# Patient Record
Sex: Male | Born: 1945 | Race: White | Hispanic: No | Smoking: Former smoker
Health system: Southern US, Community
[De-identification: ages and names within clinical notes are randomized; demographics above are authoritative.]

## PROBLEM LIST (undated history)

## (undated) DIAGNOSIS — E785 Hyperlipidemia, unspecified: Secondary | ICD-10-CM

## (undated) DIAGNOSIS — I10 Essential (primary) hypertension: Secondary | ICD-10-CM

## (undated) DIAGNOSIS — M1990 Unspecified osteoarthritis, unspecified site: Secondary | ICD-10-CM

## (undated) HISTORY — PX: FEMORAL-PERONEAL BYPASS GRAFT: SHX5163

---

## 2000-08-11 ENCOUNTER — Emergency Department (HOSPITAL_COMMUNITY): Admission: EM | Admit: 2000-08-11 | Discharge: 2000-08-11 | Payer: Self-pay | Admitting: Emergency Medicine

## 2000-08-13 ENCOUNTER — Emergency Department (HOSPITAL_COMMUNITY): Admission: EM | Admit: 2000-08-13 | Discharge: 2000-08-13 | Payer: Self-pay | Admitting: Emergency Medicine

## 2000-08-18 ENCOUNTER — Inpatient Hospital Stay (HOSPITAL_COMMUNITY): Admission: EM | Admit: 2000-08-18 | Discharge: 2000-08-21 | Payer: Self-pay | Admitting: Emergency Medicine

## 2000-08-18 ENCOUNTER — Encounter: Payer: Self-pay | Admitting: Emergency Medicine

## 2000-08-18 ENCOUNTER — Encounter: Payer: Self-pay | Admitting: Pediatrics

## 2000-08-21 ENCOUNTER — Encounter: Payer: Self-pay | Admitting: *Deleted

## 2001-06-07 ENCOUNTER — Encounter: Payer: Self-pay | Admitting: Emergency Medicine

## 2001-06-07 ENCOUNTER — Inpatient Hospital Stay (HOSPITAL_COMMUNITY): Admission: EM | Admit: 2001-06-07 | Discharge: 2001-06-14 | Payer: Self-pay | Admitting: Emergency Medicine

## 2001-06-14 ENCOUNTER — Inpatient Hospital Stay (HOSPITAL_COMMUNITY)
Admission: RE | Admit: 2001-06-14 | Discharge: 2001-06-23 | Payer: Self-pay | Admitting: Physical Medicine & Rehabilitation

## 2001-12-23 ENCOUNTER — Encounter: Payer: Self-pay | Admitting: Family Medicine

## 2001-12-23 ENCOUNTER — Ambulatory Visit (HOSPITAL_COMMUNITY): Admission: RE | Admit: 2001-12-23 | Discharge: 2001-12-23 | Payer: Self-pay | Admitting: Family Medicine

## 2002-01-13 ENCOUNTER — Ambulatory Visit (HOSPITAL_COMMUNITY): Admission: RE | Admit: 2002-01-13 | Discharge: 2002-01-13 | Payer: Self-pay | Admitting: Family Medicine

## 2002-01-13 ENCOUNTER — Encounter: Payer: Self-pay | Admitting: Family Medicine

## 2002-06-03 ENCOUNTER — Ambulatory Visit (HOSPITAL_COMMUNITY): Admission: RE | Admit: 2002-06-03 | Discharge: 2002-06-03 | Payer: Self-pay

## 2002-06-29 ENCOUNTER — Inpatient Hospital Stay (HOSPITAL_COMMUNITY): Admission: AD | Admit: 2002-06-29 | Discharge: 2002-07-11 | Payer: Self-pay | Admitting: Cardiology

## 2002-07-05 ENCOUNTER — Encounter: Payer: Self-pay | Admitting: Surgery

## 2002-07-06 ENCOUNTER — Encounter: Payer: Self-pay | Admitting: Surgery

## 2002-07-07 ENCOUNTER — Encounter: Payer: Self-pay | Admitting: Surgery

## 2002-07-08 ENCOUNTER — Encounter: Payer: Self-pay | Admitting: Surgery

## 2002-08-30 ENCOUNTER — Emergency Department (HOSPITAL_COMMUNITY): Admission: EM | Admit: 2002-08-30 | Discharge: 2002-08-30 | Payer: Self-pay | Admitting: Emergency Medicine

## 2002-09-16 ENCOUNTER — Ambulatory Visit (HOSPITAL_COMMUNITY): Admission: RE | Admit: 2002-09-16 | Discharge: 2002-09-16 | Payer: Self-pay | Admitting: Internal Medicine

## 2002-09-16 ENCOUNTER — Encounter: Payer: Self-pay | Admitting: Internal Medicine

## 2002-09-21 ENCOUNTER — Ambulatory Visit (HOSPITAL_COMMUNITY): Admission: RE | Admit: 2002-09-21 | Discharge: 2002-09-21 | Payer: Self-pay | Admitting: Internal Medicine

## 2004-04-04 ENCOUNTER — Emergency Department (HOSPITAL_COMMUNITY): Admission: EM | Admit: 2004-04-04 | Discharge: 2004-04-04 | Payer: Self-pay | Admitting: Family Medicine

## 2004-04-05 ENCOUNTER — Inpatient Hospital Stay (HOSPITAL_COMMUNITY): Admission: EM | Admit: 2004-04-05 | Discharge: 2004-04-09 | Payer: Self-pay | Admitting: Emergency Medicine

## 2004-06-26 ENCOUNTER — Ambulatory Visit: Payer: Self-pay | Admitting: *Deleted

## 2004-06-26 ENCOUNTER — Ambulatory Visit: Payer: Self-pay | Admitting: Internal Medicine

## 2004-07-03 ENCOUNTER — Ambulatory Visit: Payer: Self-pay | Admitting: Internal Medicine

## 2004-07-05 ENCOUNTER — Ambulatory Visit: Payer: Self-pay | Admitting: *Deleted

## 2004-07-11 ENCOUNTER — Ambulatory Visit: Payer: Self-pay | Admitting: *Deleted

## 2004-09-30 ENCOUNTER — Ambulatory Visit: Payer: Self-pay | Admitting: Internal Medicine

## 2004-11-26 ENCOUNTER — Ambulatory Visit: Payer: Self-pay | Admitting: Family Medicine

## 2004-12-02 ENCOUNTER — Ambulatory Visit (HOSPITAL_COMMUNITY): Admission: RE | Admit: 2004-12-02 | Discharge: 2004-12-02 | Payer: Self-pay | Admitting: Specialist

## 2005-01-27 ENCOUNTER — Ambulatory Visit (HOSPITAL_COMMUNITY): Admission: RE | Admit: 2005-01-27 | Discharge: 2005-01-27 | Payer: Self-pay | Admitting: Specialist

## 2005-04-25 ENCOUNTER — Ambulatory Visit: Payer: Self-pay | Admitting: Internal Medicine

## 2005-06-12 ENCOUNTER — Ambulatory Visit: Payer: Self-pay | Admitting: Internal Medicine

## 2005-07-01 ENCOUNTER — Ambulatory Visit: Payer: Self-pay | Admitting: Family Medicine

## 2005-08-12 ENCOUNTER — Ambulatory Visit: Payer: Self-pay | Admitting: Family Medicine

## 2005-10-22 ENCOUNTER — Ambulatory Visit: Payer: Self-pay | Admitting: Family Medicine

## 2005-11-25 ENCOUNTER — Ambulatory Visit (HOSPITAL_COMMUNITY): Admission: RE | Admit: 2005-11-25 | Discharge: 2005-11-25 | Payer: Self-pay | Admitting: Vascular Surgery

## 2005-11-28 ENCOUNTER — Ambulatory Visit: Payer: Self-pay | Admitting: Family Medicine

## 2005-12-01 ENCOUNTER — Inpatient Hospital Stay (HOSPITAL_COMMUNITY): Admission: RE | Admit: 2005-12-01 | Discharge: 2005-12-11 | Payer: Self-pay | Admitting: Vascular Surgery

## 2006-02-02 ENCOUNTER — Ambulatory Visit: Payer: Self-pay | Admitting: Family Medicine

## 2006-02-13 ENCOUNTER — Ambulatory Visit: Payer: Self-pay | Admitting: Family Medicine

## 2006-03-23 ENCOUNTER — Ambulatory Visit (HOSPITAL_COMMUNITY): Admission: RE | Admit: 2006-03-23 | Discharge: 2006-03-23 | Payer: Self-pay | Admitting: Cardiovascular Disease

## 2006-03-28 ENCOUNTER — Encounter: Admission: RE | Admit: 2006-03-28 | Discharge: 2006-03-28 | Payer: Self-pay | Admitting: Orthopedic Surgery

## 2006-03-31 ENCOUNTER — Encounter: Admission: RE | Admit: 2006-03-31 | Discharge: 2006-03-31 | Payer: Self-pay | Admitting: Orthopedic Surgery

## 2006-04-02 ENCOUNTER — Encounter (HOSPITAL_COMMUNITY): Admission: RE | Admit: 2006-04-02 | Discharge: 2006-07-01 | Payer: Self-pay | Admitting: Cardiovascular Disease

## 2006-04-03 ENCOUNTER — Ambulatory Visit (HOSPITAL_COMMUNITY): Admission: RE | Admit: 2006-04-03 | Discharge: 2006-04-03 | Payer: Self-pay | Admitting: Orthopedic Surgery

## 2006-04-07 ENCOUNTER — Encounter: Admission: RE | Admit: 2006-04-07 | Discharge: 2006-04-07 | Payer: Self-pay | Admitting: Orthopedic Surgery

## 2006-04-10 ENCOUNTER — Ambulatory Visit (HOSPITAL_COMMUNITY): Admission: RE | Admit: 2006-04-10 | Discharge: 2006-04-10 | Payer: Self-pay | Admitting: Cardiovascular Disease

## 2006-04-20 ENCOUNTER — Inpatient Hospital Stay (HOSPITAL_COMMUNITY): Admission: RE | Admit: 2006-04-20 | Discharge: 2006-04-24 | Payer: Self-pay | Admitting: Orthopedic Surgery

## 2006-05-21 ENCOUNTER — Ambulatory Visit: Payer: Self-pay | Admitting: Family Medicine

## 2006-07-23 ENCOUNTER — Ambulatory Visit: Payer: Self-pay | Admitting: Internal Medicine

## 2006-12-25 ENCOUNTER — Ambulatory Visit: Payer: Self-pay | Admitting: Internal Medicine

## 2007-02-15 ENCOUNTER — Ambulatory Visit: Payer: Self-pay | Admitting: Vascular Surgery

## 2007-05-21 ENCOUNTER — Ambulatory Visit: Payer: Self-pay | Admitting: Vascular Surgery

## 2007-08-30 ENCOUNTER — Encounter (INDEPENDENT_AMBULATORY_CARE_PROVIDER_SITE_OTHER): Payer: Self-pay | Admitting: Family Medicine

## 2007-08-30 ENCOUNTER — Ambulatory Visit: Payer: Self-pay | Admitting: Internal Medicine

## 2007-08-30 LAB — CONVERTED CEMR LAB
ALT: 12 units/L (ref 0–53)
Alkaline Phosphatase: 74 units/L (ref 39–117)
Chloride: 107 meq/L (ref 96–112)
Glucose, Bld: 143 mg/dL — ABNORMAL HIGH (ref 70–99)
Total Bilirubin: 0.4 mg/dL (ref 0.3–1.2)
Triglycerides: 97 mg/dL (ref <150)

## 2007-11-08 ENCOUNTER — Ambulatory Visit: Payer: Self-pay | Admitting: Vascular Surgery

## 2007-12-29 ENCOUNTER — Ambulatory Visit: Payer: Self-pay | Admitting: Internal Medicine

## 2008-04-12 ENCOUNTER — Ambulatory Visit: Payer: Self-pay | Admitting: Internal Medicine

## 2008-04-27 ENCOUNTER — Ambulatory Visit: Payer: Self-pay | Admitting: Internal Medicine

## 2008-04-27 LAB — CONVERTED CEMR LAB
ALT: 15 units/L (ref 0–53)
Albumin: 4.7 g/dL (ref 3.5–5.2)
Alkaline Phosphatase: 65 units/L (ref 39–117)
BUN: 22 mg/dL (ref 6–23)
CO2: 21 meq/L (ref 19–32)
Calcium: 9.2 mg/dL (ref 8.4–10.5)
Cholesterol: 131 mg/dL (ref 0–200)
Creatinine, Ser: 1.09 mg/dL (ref 0.40–1.50)
LDL Cholesterol: 81 mg/dL (ref 0–99)
Total Bilirubin: 0.4 mg/dL (ref 0.3–1.2)

## 2008-05-03 ENCOUNTER — Ambulatory Visit: Payer: Self-pay | Admitting: Vascular Surgery

## 2008-06-01 ENCOUNTER — Ambulatory Visit: Payer: Self-pay | Admitting: Internal Medicine

## 2008-06-08 ENCOUNTER — Ambulatory Visit: Payer: Self-pay | Admitting: Internal Medicine

## 2008-07-11 ENCOUNTER — Encounter: Admission: RE | Admit: 2008-07-11 | Discharge: 2008-07-11 | Payer: Self-pay | Admitting: Cardiovascular Disease

## 2008-07-18 ENCOUNTER — Ambulatory Visit: Payer: Self-pay | Admitting: Family Medicine

## 2008-07-18 LAB — CONVERTED CEMR LAB
BUN: 23 mg/dL (ref 6–23)
Calcium: 9.4 mg/dL (ref 8.4–10.5)
Chloride: 106 meq/L (ref 96–112)
Glucose, Bld: 77 mg/dL (ref 70–99)
Potassium: 4.8 meq/L (ref 3.5–5.3)
Vit D, 1,25-Dihydroxy: 25 — ABNORMAL LOW (ref 30–89)

## 2008-11-15 ENCOUNTER — Ambulatory Visit: Payer: Self-pay | Admitting: Vascular Surgery

## 2008-11-16 ENCOUNTER — Ambulatory Visit: Payer: Self-pay | Admitting: Internal Medicine

## 2008-11-16 LAB — CONVERTED CEMR LAB
ALT: 12 units/L (ref 0–53)
Alkaline Phosphatase: 60 units/L (ref 39–117)
BUN: 27 mg/dL — ABNORMAL HIGH (ref 6–23)
Basophils Absolute: 0.1 10*3/uL (ref 0.0–0.1)
Basophils Relative: 1 % (ref 0–1)
CO2: 20 meq/L (ref 19–32)
Chloride: 109 meq/L (ref 96–112)
Eosinophils Absolute: 0.4 10*3/uL (ref 0.0–0.7)
GC Probe Amp, Urine: NEGATIVE
Glucose, Bld: 125 mg/dL — ABNORMAL HIGH (ref 70–99)
Hemoglobin: 12.7 g/dL — ABNORMAL LOW (ref 13.0–17.0)
MCHC: 31.9 g/dL (ref 30.0–36.0)
MCV: 93.4 fL (ref 78.0–100.0)
Monocytes Absolute: 0.5 10*3/uL (ref 0.1–1.0)
Monocytes Relative: 6 % (ref 3–12)
Neutro Abs: 5.7 10*3/uL (ref 1.7–7.7)
Neutrophils Relative %: 60 % (ref 43–77)
Potassium: 4.5 meq/L (ref 3.5–5.3)
RBC: 4.26 M/uL (ref 4.22–5.81)
Sodium: 143 meq/L (ref 135–145)
Total Bilirubin: 0.3 mg/dL (ref 0.3–1.2)
Total Protein: 7.8 g/dL (ref 6.0–8.3)
WBC: 9.6 10*3/uL (ref 4.0–10.5)

## 2009-02-14 ENCOUNTER — Ambulatory Visit: Payer: Self-pay | Admitting: Internal Medicine

## 2009-03-01 ENCOUNTER — Ambulatory Visit: Payer: Self-pay | Admitting: Internal Medicine

## 2009-03-02 ENCOUNTER — Ambulatory Visit: Payer: Self-pay | Admitting: Internal Medicine

## 2009-03-13 ENCOUNTER — Ambulatory Visit: Payer: Self-pay | Admitting: Internal Medicine

## 2009-05-08 ENCOUNTER — Ambulatory Visit: Payer: Self-pay | Admitting: Internal Medicine

## 2009-05-08 LAB — CONVERTED CEMR LAB
Cholesterol: 141 mg/dL (ref 0–200)
HDL: 35 mg/dL — ABNORMAL LOW (ref >39)
LDL Cholesterol: 86 mg/dL (ref 0–99)
Microalb, Ur: 3.07 mg/dL — ABNORMAL HIGH (ref 0.00–1.89)
PSA: 1.37 ng/mL (ref 0.10–4.00)
Total CHOL/HDL Ratio: 4
Triglycerides: 98 mg/dL (ref <150)
VLDL: 20 mg/dL (ref 0–40)

## 2009-05-16 ENCOUNTER — Ambulatory Visit: Payer: Self-pay | Admitting: Vascular Surgery

## 2009-05-22 ENCOUNTER — Ambulatory Visit: Payer: Self-pay | Admitting: Internal Medicine

## 2009-06-21 ENCOUNTER — Ambulatory Visit: Payer: Self-pay | Admitting: Gastroenterology

## 2009-08-13 ENCOUNTER — Ambulatory Visit: Payer: Self-pay | Admitting: Internal Medicine

## 2009-08-21 ENCOUNTER — Ambulatory Visit: Payer: Self-pay | Admitting: Gastroenterology

## 2009-08-23 ENCOUNTER — Ambulatory Visit: Payer: Self-pay | Admitting: Internal Medicine

## 2009-09-05 ENCOUNTER — Telehealth (INDEPENDENT_AMBULATORY_CARE_PROVIDER_SITE_OTHER): Payer: Self-pay | Admitting: *Deleted

## 2009-09-11 ENCOUNTER — Ambulatory Visit: Payer: Self-pay | Admitting: Gastroenterology

## 2009-09-19 ENCOUNTER — Encounter: Payer: Self-pay | Admitting: Gastroenterology

## 2009-11-07 ENCOUNTER — Ambulatory Visit: Payer: Self-pay | Admitting: Vascular Surgery

## 2009-12-13 ENCOUNTER — Ambulatory Visit: Payer: Self-pay | Admitting: Internal Medicine

## 2009-12-13 LAB — CONVERTED CEMR LAB
Cholesterol: 147 mg/dL (ref 0–200)
Free T4: 1.12 ng/dL (ref 0.80–1.80)
HCT: 41.9 % (ref 39.0–52.0)
HDL: 35 mg/dL — ABNORMAL LOW (ref >39)
Hemoglobin: 13.2 g/dL (ref 13.0–17.0)
Lymphocytes Relative: 31 % (ref 12–46)
Lymphs Abs: 3 10*3/uL (ref 0.7–4.0)
Monocytes Relative: 5 % (ref 3–12)
Neutro Abs: 5.6 10*3/uL (ref 1.7–7.7)
Neutrophils Relative %: 58 % (ref 43–77)
RBC: 4.57 M/uL (ref 4.22–5.81)
TSH: 1.074 microintl units/mL (ref 0.350–4.500)
Total CHOL/HDL Ratio: 4.2
Triglycerides: 91 mg/dL (ref <150)
VLDL: 18 mg/dL (ref 0–40)

## 2010-02-26 ENCOUNTER — Ambulatory Visit: Payer: Self-pay | Admitting: Internal Medicine

## 2010-02-26 LAB — CONVERTED CEMR LAB
ALT: 14 units/L (ref 0–53)
Albumin: 4.5 g/dL (ref 3.5–5.2)
Alkaline Phosphatase: 57 units/L (ref 39–117)
CO2: 19 meq/L (ref 19–32)
Hgb A1c MFr Bld: 9.7 % — ABNORMAL HIGH (ref <5.7)
LDL Cholesterol: 79 mg/dL (ref 0–99)
Potassium: 4.7 meq/L (ref 3.5–5.3)
Sodium: 138 meq/L (ref 135–145)
Triglycerides: 168 mg/dL — ABNORMAL HIGH (ref <150)

## 2010-04-05 ENCOUNTER — Ambulatory Visit: Payer: Self-pay | Admitting: Internal Medicine

## 2010-04-05 LAB — CONVERTED CEMR LAB
Albumin: 4.7 g/dL (ref 3.5–5.2)
Basophils Absolute: 0 10*3/uL (ref 0.0–0.1)
Basophils Relative: 0 % (ref 0–1)
CO2: 22 meq/L (ref 19–32)
Chloride: 103 meq/L (ref 96–112)
Creatinine, Ser: 1.03 mg/dL (ref 0.40–1.50)
Eosinophils Absolute: 0.3 10*3/uL (ref 0.0–0.7)
Eosinophils Relative: 3 % (ref 0–5)
Glucose, Bld: 114 mg/dL — ABNORMAL HIGH (ref 70–99)
Hgb A1c MFr Bld: 8.4 % — ABNORMAL HIGH (ref <5.7)
LDL Cholesterol: 98 mg/dL (ref 0–99)
MCHC: 31.7 g/dL (ref 30.0–36.0)
MCV: 94.9 fL (ref 78.0–100.0)
Monocytes Absolute: 0.5 10*3/uL (ref 0.1–1.0)
Monocytes Relative: 6 % (ref 3–12)
Neutro Abs: 5.2 10*3/uL (ref 1.7–7.7)
RBC: 4.12 M/uL — ABNORMAL LOW (ref 4.22–5.81)
RDW: 15.4 % (ref 11.5–15.5)
Total CHOL/HDL Ratio: 4
VLDL: 21 mg/dL (ref 0–40)
WBC: 8.8 10*3/uL (ref 4.0–10.5)

## 2010-08-22 ENCOUNTER — Ambulatory Visit: Payer: Self-pay | Admitting: Vascular Surgery

## 2010-09-04 ENCOUNTER — Encounter
Admission: RE | Admit: 2010-09-04 | Discharge: 2010-10-09 | Payer: Self-pay | Source: Home / Self Care | Attending: Vascular Surgery | Admitting: Vascular Surgery

## 2010-11-07 ENCOUNTER — Encounter (INDEPENDENT_AMBULATORY_CARE_PROVIDER_SITE_OTHER): Payer: Self-pay | Admitting: Family Medicine

## 2010-11-07 LAB — CONVERTED CEMR LAB
Basophils Absolute: 0.1 10*3/uL (ref 0.0–0.1)
CO2: 24 meq/L (ref 19–32)
Calcium: 9.9 mg/dL (ref 8.4–10.5)
Chloride: 102 meq/L (ref 96–112)
Creatinine, Ser: 1.17 mg/dL (ref 0.40–1.50)
Glucose, Bld: 146 mg/dL — ABNORMAL HIGH (ref 70–99)
Lymphs Abs: 4 10*3/uL (ref 0.7–4.0)
MCV: 93.6 fL (ref 78.0–100.0)
Neutro Abs: 6.6 10*3/uL (ref 1.7–7.7)
Neutrophils Relative %: 55 % (ref 43–77)
RBC: 4.22 M/uL (ref 4.22–5.81)
RDW: 13.3 % (ref 11.5–15.5)

## 2010-11-08 ENCOUNTER — Ambulatory Visit (HOSPITAL_COMMUNITY)
Admission: RE | Admit: 2010-11-08 | Discharge: 2010-11-08 | Payer: Self-pay | Source: Home / Self Care | Attending: Family Medicine | Admitting: Family Medicine

## 2010-11-14 ENCOUNTER — Encounter (INDEPENDENT_AMBULATORY_CARE_PROVIDER_SITE_OTHER): Payer: Self-pay | Admitting: Family Medicine

## 2011-01-09 ENCOUNTER — Ambulatory Visit (HOSPITAL_COMMUNITY)
Admission: RE | Admit: 2011-01-09 | Discharge: 2011-01-09 | Disposition: A | Discharge status: Home / Self Care | Payer: Medicare Other | Source: Ambulatory Visit | Attending: Cardiovascular Disease | Admitting: Cardiovascular Disease

## 2011-01-09 ENCOUNTER — Other Ambulatory Visit (HOSPITAL_COMMUNITY): Payer: Self-pay | Admitting: Cardiovascular Disease

## 2011-01-09 DIAGNOSIS — M549 Dorsalgia, unspecified: Secondary | ICD-10-CM

## 2011-01-09 DIAGNOSIS — I700 Atherosclerosis of aorta: Secondary | ICD-10-CM | POA: Insufficient documentation

## 2011-01-09 DIAGNOSIS — M5137 Other intervertebral disc degeneration, lumbosacral region: Secondary | ICD-10-CM | POA: Insufficient documentation

## 2011-01-09 DIAGNOSIS — M6281 Muscle weakness (generalized): Secondary | ICD-10-CM | POA: Insufficient documentation

## 2011-01-09 DIAGNOSIS — M47817 Spondylosis without myelopathy or radiculopathy, lumbosacral region: Secondary | ICD-10-CM | POA: Insufficient documentation

## 2011-01-09 DIAGNOSIS — M51379 Other intervertebral disc degeneration, lumbosacral region without mention of lumbar back pain or lower extremity pain: Secondary | ICD-10-CM | POA: Insufficient documentation

## 2011-01-09 DIAGNOSIS — I77819 Aortic ectasia, unspecified site: Secondary | ICD-10-CM | POA: Insufficient documentation

## 2011-01-11 ENCOUNTER — Other Ambulatory Visit: Payer: Self-pay | Admitting: Internal Medicine

## 2011-01-14 NOTE — Telephone Encounter (Signed)
Was this refilled by Dr. Reche Dixon 3 days ago?  If so, why is it in the in-basket this morning?

## 2011-02-19 ENCOUNTER — Encounter (INDEPENDENT_AMBULATORY_CARE_PROVIDER_SITE_OTHER): Payer: Medicare Other

## 2011-02-19 DIAGNOSIS — Z48812 Encounter for surgical aftercare following surgery on the circulatory system: Secondary | ICD-10-CM

## 2011-02-19 DIAGNOSIS — I724 Aneurysm of artery of lower extremity: Secondary | ICD-10-CM

## 2011-02-25 NOTE — Procedures (Unsigned)
BYPASS GRAFT EVALUATION  INDICATION:  Left lower extremity bypass graft.  HISTORY: Diabetes:  Yes. Cardiac:  No. Hypertension:  Yes. Smoking:  Previous. Previous Surgery:  Superficial femoral to peroneal artery bypass graft on 12/07/2005.  SINGLE LEVEL ARTERIAL EXAM                              RIGHT              LEFT Brachial:                    130                132 Anterior tibial:             Monophasic         Biphasic Posterior tibial:            Monophasic         Monophasic Peroneal: Ankle/brachial index:        TBI = 0.32         TBI = 0.47  PREVIOUS ABI:  Date: 08/22/2010  RIGHT:  TBI = 0.28  LEFT:  TBI = 0.43  LOWER EXTREMITY BYPASS GRAFT DUPLEX EXAM:  DUPLEX: 1. Monophasic/biphasic Doppler waveforms noted throughout the left     lower extremity bypass graft with no increasing velocities. 2. No flow was noted in the ligated left popliteal artery aneurysm,     which measures 2.7 x 2.4 cm.  The maximum diameter measurement of     the right popliteal artery is 1.1 x 1.2 cm.  IMPRESSION: 1. Patent left superficial femoral to peroneal artery bypass graft     with no evidence of stenosis. 2. The bilateral ankle brachial indices were unreliable due to     noncompressible vessels.  The bilateral toe brachial indices     suggest severely decreased perfusion of the right lower extremity     digits and moderately decreased perfusion of the left lower     extremity digits.  The bilateral toe brachial indices are stable     when compared to the previous examination.  ___________________________________________ Janetta Hora Fields, MD  CH/MEDQ  D:  02/20/2011  T:  02/20/2011  Job:  [id number]

## 2011-03-04 NOTE — Procedures (Signed)
BYPASS GRAFT EVALUATION   INDICATION:  Lower extremity bypass graft evaluation.   HISTORY:  Diabetes:  Yes.  Cardiac:  CABG.  Hypertension:  Yes.  Smoking:  Quit.  Previous Surgery:  Ligation of left popliteal artery aneurysm and left  superficial femoral artery to peroneal artery bypass performed by Dr.  Darrick Penna on 12/07/2005.   SINGLE LEVEL ARTERIAL EXAM                               RIGHT              LEFT  Brachial:                    135                136  Anterior tibial:             90                 128  Posterior tibial:            63                 143  Peroneal:  Ankle/brachial index:        0.67               1.05   PREVIOUS ABI:  Date:  05/03/2008  RIGHT:  0.73  LEFT:  1.06   LOWER EXTREMITY BYPASS GRAFT DUPLEX EXAM:   DUPLEX:  Biphasic duplex waveform noted from the left inflow artery,  within the graft and outflow artery.  Ligated left popliteal artery aneurysm measures 2.77 x 2.66 cm.   IMPRESSION:  1. Right lower extremity ABI suggests moderate arterial disease.  2. Normal ABI on the left lower extremity.   ___________________________________________  Janetta Hora Fields, MD   AC/MEDQ  D:  11/15/2008  T:  11/15/2008  Job:  [id number]

## 2011-03-04 NOTE — Procedures (Signed)
BYPASS GRAFT EVALUATION   INDICATION:  Followup, left lower extremity bypass graft.  Last week had  two days of left lower extremity pain, which has now vanished.   HISTORY:  Diabetes:  Yes.  Cardiac:  CABG.  Hypertension:  Yes.  Smoking:  Quit.  Previous Surgery:  Ligation of left popliteal artery aneurysm and left  SFA-to-peroneal artery bypass graft by Dr. Darrick Penna on 12/07/05.   SINGLE LEVEL ARTERIAL EXAM                               RIGHT              LEFT  Brachial:                    131                130  Anterior tibial:             82                 139  Posterior tibial:            95                 133  Peroneal:                                       136  Ankle/brachial index:        0.73               1.06   PREVIOUS ABI:  Date: 11/08/07  RIGHT:  0.70  LEFT:  1.12   LOWER EXTREMITY BYPASS GRAFT DUPLEX EXAM:   DUPLEX:  1. Doppler arterial waveforms appear biphasic proximal to, within, and      distal to the bypass graft.  2. Stable popliteal measurements with R = 1.18 cm X 1.17 cm, L =      residual 2.54 cm X 2.53 cm.   IMPRESSION:  1. Patent left superficial femoral artery to peroneal artery bypass      graft.  2. Stable bilateral ankle brachial indices.  3. Stable bilateral popliteal measurements.  4. No significant changes from previous study.   ___________________________________________  Janetta Hora Fields, MD   AS/MEDQ  D:  05/03/2008  T:  05/03/2008  Job:  [id number]

## 2011-03-04 NOTE — Procedures (Signed)
BYPASS GRAFT EVALUATION   INDICATION:  Follow up bypass graft placement.   HISTORY:  Diabetes:  Yes.  Cardiac:  No.  Hypertension:  Yes.  Smoking:  Previously.  Previous Surgery:  Left SFA to peroneal artery bypass graft, 12/07/2005,  ligation of left popliteal aneurysm.   SINGLE LEVEL ARTERIAL EXAM                               RIGHT              LEFT  Brachial:                    121                119  Anterior tibial:             60                 >255  Posterior tibial:            78                 >255  Peroneal:  Ankle/brachial index:        0.64               Noncompressible   PREVIOUS ABI:  Date: 11/07/2009  RIGHT:  0.72  LEFT:  1.51   LOWER EXTREMITY BYPASS GRAFT DUPLEX EXAM:   DUPLEX:  1. Biphasic Doppler waveforms noted throughout the left lower      extremity bypass graft and its native vessels.  There is a velocity      of 216 cm/s noted at the distal anastomosis.  2. Occluded left popliteal artery aneurysm measuring 2.3 X 2.4 cm.   IMPRESSION:  1. Patent left superficial femoral artery to peroneal artery bypass      graft with velocities noted on the following work sheet.  2. Stable left popliteal artery aneurysm diameters, as described      above.  3. Unreliable ankle brachial indices due to the presence of calcified      vessels.  4. Right toe brachial index 0.28.  5. Left toe brachial index 0.43.   ___________________________________________  Janetta Hora. Fields, MD   EM/MEDQ  D:  08/22/2010  T:  08/22/2010  Job:  [id number]

## 2011-03-04 NOTE — Procedures (Signed)
BYPASS GRAFT EVALUATION   INDICATION:  Follow up left lower extremity bypass graft.  Left lower  extremity gives out about once every 2 months.   HISTORY:  Diabetes:  Yes  Cardiac:  CABG  Hypertension:  Yes  Smoking:  Quit  Previous Surgery:  Ligation of popliteal aneurysm and left superficial  femoral artery to peroneal artery bypass graft, 12/07/05, by Dr. Darrick Penna   SINGLE LEVEL ARTERIAL EXAM                               RIGHT              LEFT  Brachial:                    136                140  Anterior tibial:             91                 175  Posterior tibial:            87                 172  Peroneal:                                       182  Ankle/brachial index:        0.65               1.3   PREVIOUS ABI:  Date: 11/15/08  RIGHT:  0.67  LEFT:  1.05   LOWER EXTREMITY BYPASS GRAFT DUPLEX EXAM:   DUPLEX:  1. Patent left superficial femoral artery to peroneal artery bypass      graft with biphasic flow proximal to, within, and distal to it.  2. Left popliteal aneurysm is stable from previous study; however,      larger than 1 year ago.  3. Flow noted in left popliteal artery aneurysm.  Measurement today is      2.76 cm X 2.70 cm.    IMPRESSION:  1. Right ankle brachial index appears stable and monophasic.  2. Left ankle brachial index shows increase; however, still within      normal limits and biphasic.  3. Patent left superficial femoral artery to peroneal artery bypass      graft.  4. Stable left popliteal artery aneurysm with flow noted into it (new      finding).   Dr. Darrick Penna was informed of blood flow into aneurysm and stated to keep  on normal protocol.       ___________________________________________  Caleb Hora. Fields, MD   AS/MEDQ  D:  05/16/2009  T:  05/16/2009  Job:  [id number]

## 2011-03-04 NOTE — Assessment & Plan Note (Signed)
OFFICE VISIT   Caleb, Castillo  DOB:  03/23/1946                                       06/13/2010  WUJWJ#:[id number]   The patient was a no-show for his appointment on June 13, 2010.  He  was to have a noninvasive vascular lab for evaluation and surveillance.  He was a no-show for this or for his office visit.     Janetta Hora. Fields, MD  Electronically Signed   CEF/MEDQ  D:  06/13/2010  T:  06/13/2010  Job:  [id number]

## 2011-03-04 NOTE — Procedures (Signed)
BYPASS GRAFT EVALUATION   INDICATION:  Status post left SFA to peroneal bypass graft.   HISTORY:  Diabetes:  Orally controlled.  Cardiac:  CABG on 07/06/2002.  Hypertension:  Yes.  Smoking:  Quit.  Previous Surgery:  Ligation of left popliteal artery aneurysm and left  SFA to peroneal artery bypass graft with nonreversed saphenous vein by  Dr. Darrick Penna 12/07/2005.   SINGLE LEVEL ARTERIAL EXAM                               RIGHT              LEFT  Brachial:                    148                140  Anterior tibial:             102 (monophasic)   156 (biphasic)  Posterior tibial:            102 (monophasic)   inaudible  Peroneal:                                       156 (biphasic)  Ankle/brachial index:        0.69               > 1.0   PREVIOUS ABI:  Date: 02/15/2007  RIGHT:  0.71  LEFT:  > 1.0   LOWER EXTREMITY BYPASS GRAFT DUPLEX EXAM:  See attached sheet for  velocities   DUPLEX:  Doppler arterial waveforms appear biphasic proximal to, within  and distal to left SFA to peroneal bypass graft.   IMPRESSION:  Patent left SFA to peroneal bypass graft.  ABIs appear stable from previous studies.  Popliteal measurements appear stable.  Right popliteal artery = 1.18 cm x 1.17 cm., left popliteal artery  (residual) = 2.59 cm x 2.57 cm.   ___________________________________________  Caleb Hora. Fields, MD   AS/MEDQ  D:  05/21/2007  T:  05/22/2007  Job:  [id number]

## 2011-03-04 NOTE — Assessment & Plan Note (Signed)
OFFICE VISIT   BILLYE, NYDAM  DOB:  October 12, 1946                                       08/22/2010  BJYNW#:[id number]   HISTORY OF PRESENT ILLNESS:  The patient is a 65 year old male who I  repaired a popliteal aneurysm on in 2007.  He returns today complaining  of lower extremity pain.  He states that the pain in his legs has been  going on for approximately 2 years.  He states that most of the pain is  in his left knee.  He also has history of chronic back pain.  He states  both legs hurt from the feet all the way up.  He is a very poor  historian.  He was unable to name any of his doctors for me today.  He  was as unable to tell me any of his medications.  He is very sketchy on  the details of his pain or any history related to this.   CHRONIC MEDICAL PROBLEMS:  Include diabetes, hypertension, elevated  cholesterol.  These are all currently controlled although again he did  not know which medications he was on to control these.   SOCIAL HISTORY:  He is single.  He is a former smoker but quit 15-20  years ago.  He is retired.  He does not consume alcohol regularly.   FAMILY HISTORY:  Is not remarkable for significant peripheral arterial  disease or coronary artery disease.   REVIEW OF SYSTEMS:  A full 12 point review of systems was performed with  the patient today.  Please see intake referral form for details  regarding this.   PHYSICAL EXAM:  Vital signs:  Blood pressure is 135/76 in the left arm,  heart rate is 96 and regular, oxygen saturation is 97% on room air.  HEENT:  Unremarkable.  Neck:  Two plus carotid pulses without bruit.  Chest:  Clear to auscultation.  Cardiac:  Regular rate and rhythm  without murmur.  Abdomen:  Soft, nontender, nondistended with an  umbilical hernia which is easily reducible and a diastasis recti.  He  has no masses.  Extremities:  He has 2+ radial pulses.  He has 2+  femoral pulses.  He has 2+ popliteal pulse on the  left side.  He has a  3+ popliteal pulse on the right side.  He has absent pedal pulses.  He  has no open ulcerations or sores on the feet.  On ambulation he has a  slightly wide-based gait and walks with both knees slightly flexed.  He  is able to ambulate.  Skin:  Has no open ulcers or rashes.  Neurologic:  Shows symmetric upper extremity and lower extremity motor strength which  is 5/5.   He had bilateral ABIs performed today which on the left side were  noncompressible with biphasic waveforms.  On the right side his ABI was  0.64 with monophasic waveforms.  Digit pressure on the right was 34  compared to 52 on the left.   Bypass graft is patent by duplex exam in the left lower extremity.   In summary, the patient has evidence of peripheral arterial disease but  it does not appear very severe in nature although he may have some small  vessel disease in his feet bilaterally.  He probably also has some  peripheral neuropathy secondary to  his diabetes.  Although I believe  that most likely his leg pain is multifactorial in nature with problem a  combination of his back, degenerative arthritis, neuropathy and  peripheral arterial disease.  He is currently not at risk of limb loss  in his feet as he has adequate perfusion to both feet currently.  I  believe the best option for him would be physical therapy to see whether  or not they can improve some of his ambulation with some strengthening  exercises.  I have prescribed this for him today.  He will return for a  followup graft scan in the next few months.  Will also rescan his right  popliteal artery and make sure he is not developing an aneurysm on that  side since he did have a slightly prominent pulse on the right side.     Janetta Hora. Fields, MD  Electronically Signed   CEF/MEDQ  D:  08/22/2010  T:  08/23/2010  Job:  [id number]   cc:   Ricki Rodriguez, M.D.

## 2011-03-07 NOTE — Discharge Summary (Signed)
NAME:  Caleb Castillo, Caleb Castillo NO.:  [id number]   MEDICAL RECORD NO.:  [id number]                   PATIENT TYPE:  INP   LOCATION:  2012                                 FACILITY:  [facility]   PHYSICIAN:  Evelene Croon, MD                    DATE OF BIRTH:  [birth year]   DATE OF ADMISSION:  06/29/2002  DATE OF DISCHARGE:  07/11/2002                                 DISCHARGE SUMMARY   PRIMARY ADMITTING DIAGNOSES:  1. Severe three-vessel coronary artery disease.  2. Unstable angina.   ADDITIONAL DIAGNOSES:  1. History of previous cerebrovascular accident.  2. Hyperlipidemia.  3. Insulin-dependent diabetes mellitus.  4. Hypertension.  5. History of tobacco use.  6. Peripheral vascular disease.   PROCEDURES PERFORMED:  1. Cardiac catheterization.  2. Coronary artery bypass grafting x 5 (left internal mammary artery to the     LAD, saphenous vein graft to the diagonal, sequential saphenous vein     graft to the posterior descending, posterolateral, and the circumflex     coronaries).   HISTORY:  The patient is a 65 year old male, who has had several episodes  recently of substernal chest pain upon exertion.  He was initially seen at  [facility] and was referred to Dr. Andee Lineman for stress test.  His stress test  was positive for anterior and inferior as well as apical ischemia.  It was  felt that in light of his unstable angina-type symptoms and a normal  Cardiolite study, he should be admitted to [facility], started on Lovenox and IV  nitroglycerin and scheduled for cardiac catheterization.   HOSPITAL COURSE:  He was admitted on June 29, 2002.  He underwent  cardiac catheterization on June 30, 2002, and was found to have  significant three-vessel coronary artery disease which was not felt to be  amenable to surgical revascularization.  Cardiothoracic surgery consultation  was obtained.  It was felt that he was a good candidate for surgical  revascularization.  He was taken to the operating room on July 06, 2002, where he underwent CABG x 5 by Dr. Laneta Simmers with the above-noted grafts.  He tolerated the procedure well and was transferred to the SICU in stable  condition.  He was extubated shortly after surgery.  He was hemodynamically  stable and doing well postop day one.  He was transferred to the floor at  that time.  Postoperatively, he has done well.  He was somewhat anemic  postoperatively.  His hemoglobin and his hematocrit dropped to a low of 7.9  and 23.3, respectively.  He was started in iron replacement therapy.  His  anemia slowly improved and leveled off at 8.5 and 24.7.  Otherwise, he has  done well postoperatively.  His blood sugars have remained stable.  He has  been ambulating in the halls without difficulty.  He has been weaned off  supplemental oxygen.  His surgical incisions are healing well.  He is  tolerating a regular diet and is having normal bowel and bladder function.  It is felt at this time, he is ready for discharge home.   DISCHARGE MEDICATIONS:  1. Plavix 75 mg q.d.  2. Tylox 1-2 q.4h. p.r.n. for pain.  3. Enteric-coated aspirin 325 mg q.d.  4. Colace 100 mg b.i.d.  5. Niferex 150 mg b.i.d.  6. Atenolol 50 mg q.d.  7. Glucotrol 5 mg q.d.  8. Zocor 10 mg q.h.s.   DISCHARGE INSTRUCTIONS:  1. He was asked to refrain from driving, heavy lifting, or strenuous     activity.  2. He may continue daily walking and use of incentive spirometer.  3. He was asked to shower daily and clean his incisions with soap and water.   DISCHARGE FOLLOW UP:  1. He will see Dr. Andee Lineman in the office in two weeks and have a chest x-ray     at that time.  2. He will then follow up with Dr. Laneta Simmers in the [facility] office in three weeks     and was asked to bring his chest x-ray to this appointment.  3. Home health nursing will be arranged to remove his staples in one week.  4. He will also be contacted [facility]     for outpatient diabetes education.     Coral Ceo, PA                          Evelene Croon, MD    GC/MEDQ  D:  07/27/2002  T:  07/30/2002  Job:  [id number]   cc:   Learta Codding, M.D. [facility]   [facility]

## 2011-03-07 NOTE — Discharge Summary (Signed)
NAME:  ELAND, LAMANTIA NO.:  [id number]   MEDICAL RECORD NO.:  [id number]          PATIENT TYPE:  INP   LOCATION:  1424                         FACILITY:  [facility]   PHYSICIAN:  Georges Lynch. Gioffre, M.D.DATE OF BIRTH:  11/13/1945   DATE OF ADMISSION:  04/20/2006  DATE OF DISCHARGE:  04/24/2006                                 DISCHARGE SUMMARY   ADMISSION DIAGNOSES:  1.  Severe spinal stenosis.  2.  Herniated nucleus pulposus.  3.  History of coronary atherosclerosis.  4.  History of old myocardial infarction.  5.  History of abdominal aneurysm.  6.  Hypertension.  7.  Diabetes.   DISCHARGE DIAGNOSES:  1.  Decompressive laminectomies at L2-3 and L3-4 and L4-5 with a      microdiskectomy at L2-3 on the left.  2.  History of coronary atherosclerosis.  3.  History of old myocardial infarction.  4.  History of abdominal aneurysm.  5.  History of hypertension.  6.  History of diabetes.   HISTORY OF PRESENT ILLNESS:  The patient is a 65 year old gentleman, who has  been evaluated by Dr. Darrelyn Hillock for severe lower back pain and weakness into  both lower extremities.  The patient had a significant amount of discomfort  during his evaluation phase, had MRIs and CT myelogram confirmed that he had  severe spinal stenosis and a herniated disc.  The patient was evaluated  preoperatively by Dr. Lum Keas from cardiac standpoint with stress test and  cardiac workup and has been cleared for surgery and is approved and is going  to proceed at this time.   SURGICAL PROCEDURE:  On July2, 2007, the patient was taken to the OR by Dr.  Ranee Gosselin and assisted by Dr. Simonne Come and under general anesthesia,  the patient underwent a microdiskectomy of the left L2-3 and a decompressive  laminectomy at L2-3, L3-4 and L4-5 without complications.  Estimated blood  loss was 300 mL.  The patient was transferred to the recovery room in good  condition.   CONSULTS:  The following routine  consults were requested:  1.  Physical therapy.  2.  Case management.   HOSPITAL COURSE:  On April 20, 2006 the patient was admitted to [facility] under the care of Dr. Ranee Gosselin.  The patient was taken to the  OR for a decompressive laminectomy at L2-3, L3-4 and L4-5 with a left-sided  microdiskectomy at L2-3.  The patient tolerated the procedure well and was  transferred to the recovery room and then to the orthopedic floor in good  condition.  Postoperatively, the patient did fairly well.  He had some  weakness in his right lower extremity and his left ankle and this was  consistent with the preoperative evaluation, but he was much more  comfortable.  His surgical site was clean.  Over the next several days, the  patient's weakness improved, his discomfort continued to improve.  He had  difficulty with physical therapy and ambulating for the first couple of days  but on postop day #4, he was ambulating well without any complications  with  the use of a walker in physical therapy.  The patient's vital signs remained  stable.  No signs of infection and his wound remained benign.  It was felt  on postop day #4, the patient was orthopedically and medically stable and  ready for discharge home to continue outpatient home health physical therapy  and was discharged in good condition.   LABS:  Hemoglobin on July 3 was 10.7 with hematocrit of 31.3.  Preoperative  urinalysis was normal.  Admission EKG found normal sinus rhythm at 71 beats  per minute.   MEDICATIONS UPON DISCHARGE FROM ORTHOPEDIC FLOOR:  1.  Amaryl 4 mg a day.  2.  Avandia 2 mg a day.  3.  Atenolol 50 mg a day.  4.  Zocor 10 mg a day.  5.  Iron 150 mg twice a day.  6.  Mobic 15 mg a day.  7.  Percocet 1-2 tablets every 4-6 hours p.r.n.  8.  Senokot S 1 tablet a day.  9.  Laxative or enema of choice p.r.n.  10. Robaxin 500 mg p.o. q.6 h., p.r.n.  11. Reglan 10 mg p.o. q. 6 h., p.r.n.  12. The patient was  monitored on the hospital standard insulin sliding scale      for NovoLog per protocol.  13. Flomax 0.4 mg a day.   DISCHARGE INSTRUCTIONS:   DIET:  The patient is to maintain a 1800 calorie ADA diet.   ACTIVITY:  The patient is to walk with assistance of use of a walker.   WOUND CARE:  The patient should change his dressing on his back daily.   MEDICATIONS:  The patient should continue routine home meds as previously  taken with addition of pain medicines:  1.  Percocet 1-2 tablets every 4-6 hours.  2.  Robaxin 500 mg p.o. q.6 h., P.r.n. muscle spasms.  3.  Flomax 0.4 mg a day until urinating without difficulty.   FOLLOWUP:  1.  Patient needs a followup appointment with Dr. Darrelyn Hillock 2 weeks from date      of surgery.  The patient should call      [phone] extension [phone] for an appointment.  2.  Outpatient physical therapy and occupational therapy provided by      [place].   PATIENT CONDITION UPON DISCHARGE TO HOME:  Listed as improved and good.      Jamelle Rushing, P.A.    ______________________________  Georges Lynch Darrelyn Hillock, M.D.    RWK/MEDQ  D:  05/19/2006  T:  05/19/2006  Job:  [id number]

## 2011-03-07 NOTE — H&P (Signed)
NAME:  Caleb Castillo, Caleb Castillo                         ACCOUNT NO.:  [id number]   MEDICAL RECORD NO.:  [id number]                   PATIENT TYPE:  INP   LOCATION:  5018                                 FACILITY:  [facility]   PHYSICIAN:  Hettie Holstein, D.O.                 DATE OF BIRTH:  1946/09/26   DATE OF ADMISSION:  04/05/2004  DATE OF DISCHARGE:                                HISTORY & PHYSICAL   PRIMARY CARE PHYSICIAN:  [facility], unassigned to [place].   CHIEF COMPLAINT:  Pain and swelling, right hand.   HISTORY OF PRESENT ILLNESS:  This is a 65 year old diabetic male with  history of coronary artery disease status post coronary artery bypass  grafting apparently back in 2003 who sustained an injury to his hand several  days ago.  He had been treated with antibiotics on an outpatient basis and  subsequently only presented to Urgent Care yesterday and had an I&D of  abscess on his right hand.  Cultures today and review reveal gram-positive  cocci in pairs and clusters.  No C&S results at this time.  He was started  on oral antibiotics which he does not know, and I do not have available  records at this time.  I&D was under direction of Aundria Rud, M.D.  In  any event, he followed up at [facility] as directed today and reported  worsening pain, swelling, and throbbing in his hand and subsequently was  directed to [facility].   He, on further questioning, was uncertain about where he sustained the  injury.  He felt possibly he had been bitten by something, but he was not  quite sure.  In any event, he was evaluated in the emergency department with  noted worsening swelling, pain, throbbing in his hand, and subsequently was  directed for hospital admission.  He has no records accessible by his  medical record number on E-Chart at this time regarding hospitalization for  his bypass; however, he does bring in a discharge sheet that noted he had  followups with Dr. Laneta Simmers  which I suspect is probable in relation to his  coronary artery bypass grafting in September 2003.   He has a history of diabetes mellitus, not currently using insulin at home.  He has hypertension, hypercholesterolemia.  He reports that he has had some  strokes.  This is vaguely described by the patient, and he cannot further  clarify.  History of appendectomy.  He retains his gallbladder.   SOCIAL HISTORY:  Significant for smoking and alcohol approximately 15 years  ago.  He states he has quit.  He has no children.  He has a brother who  lives close by.   FAMILY HISTORY:  His mother died at age 63 with Alzheimer's disease.  His  father died at a young age suddenly, cause not known to the patient.   MEDICATIONS:  1. Aspirin 325 mg daily.  2. Colace.  3. Plavix 75 mg a day.  4. Atenolol 50 mg a day.  5. Zocor 10 mg a day.  6. Glucotrol 5 mg a day.  7. Niferex 150 mg p.o. b.i.d.   ALLERGIES:  He reports allergy to SULFA MEDICATIONS, reported swelling of  his face and hands.   REVIEW OF SYSTEMS:  The patient denies any nausea, vomiting, or diarrhea.  No fevers, chills, night sweats at home.  Otherwise, he denies any chest  pain or shortness of breath.  No abdominal pain, no diarrhea, no  hematochezia, melena, coffee ground emesis.  He does report colonoscopy but  cannot provide details and time frame.  Otherwise, he does report having a  tetanus shot over the past two years.  He cannot recall exactly the date;  however, he states this was given to him at [facility].   PHYSICAL EXAMINATION:  VITAL SIGNS:  Stable with blood pressure 122/75,  temperature 98.2, heart rate 98, respirations 20.  GENERAL:  The patient is alert and oriented in no acute distress.  HEENT:  Head is normocephalic.  Extraocular muscles are intact.  NECK:  Supple, nontender.  No palpable thyromegaly or mass.  CHEST: Clear to auscultation bilaterally.  HEART:  Normal S1 and S2.  No appreciable  murmur.  EXTREMITIES:  Swelling of his right hand, specifically with regard to the  ulnar aspect of his right hand with extension of the erythema beyond  demarcation with black marker from previous outline.  He had swelling of his  fingers up to his second digit and had some difficulty closing his hand;  however, he had no loss of sensation, and his capillary refill was less than  3.  He had good pulses.   LABORATORY AND X-RAY DATA:  There is no laboratory data available at this  time except for the culture that was obtained from the I&D yesterday,  currently revealing only gram-positive cocci in pairs and clusters, no C&S  available at this time.   IMPRESSION:  1. Cellulitis status post abscess incision and drainage.  2. Suspect Staphylococcus infection.  Vancomycin was initiated in the     emergency department in addition to Ancef.  3. Diabetes mellitus.  Will assess control with hemoglobin A1C.  Will     initiate sliding scale coverage for tighter control.  4. Coronary artery disease, currently stable.  5. Hypertension, currently stable.   PLAN:  At this time, contacted orthopedic surgeon, Dr. Ranell Patrick, to assist  with patient's wound care, I&D, and further recommendations.  I am awaiting  currently routine lab work including CBC, BMP, and plain x-ray.  I will  continued IV antibiotics until regression of erythema and positive response  to antibiotics, then transition to p.o. as well as vigilant wound care per  orthopedic surgery.                                                Hettie Holstein, D.O.    ESS/MEDQ  D:  04/05/2004  T:  04/07/2004  Job:  [id number]   cc:   [facility]

## 2011-03-07 NOTE — Discharge Summary (Signed)
[place]  Patient:    Caleb Castillo, Caleb Castillo Visit Number: [id number] MRN: [id number]          Service Type: MED Location: [phone] Attending Physician:  Mick Sell Dictated by:   Marlan Palau, M.D. Adm. Date:  06/07/2001 Disc. Date: 06/14/01   CC:         [facility], [address]  [facility]   Discharge Summary  ADMISSION DIAGNOSIS:  New onset of gait instability, rule out brainstem infarct.  DISCHARGE DIAGNOSES: 1. Completion of right Wallenberg syndrome. 2. Diabetes. 3. Hypertension.  PROCEDURES DURING ADMISSION: 1. MRI scan of the brain. 2. MRI angiogram.  COMPLICATIONS OF PROCEDURES:  None.  HISTORY OF PRESENT ILLNESS:  The patient is a 65 year old, white male, born 30-Jul-1946, with a history of a prior stroke in October 2001.  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  The patient had no brainstem involvement at that time.  The patient had gait instability around that time which improved and otherwise the patient has done well.  The patient comes back into the hospital on the 07 June 2001, with onset of gait instability, tendency to fall to the right.  The patient was unable to walk without assistance.  The patient was seen at [facility], sent to [facility] for CT of the head showing no acute lesions, and was sent to the [facility] Emergency Room.  No progression of symptoms was noted throughout that day.  The patient noted a right-sided headache, denies double vision, difficulty swallowing, vertigo, true weakness, syncope, seizures.  The patient was admitted for further evaluation.  PAST MEDICAL HISTORY:  Notable for: 1. History of new onset of gait disturbance consistent with a small brainstem    stroke. 2. Hypertension. 3. Diabetes. 4. Hypercholesterolemia.  MEDICATIONS ON ADMISSION: 1. Glucotrol  XL 5 mg daily. 2. Plavix 75 mg a day. 3. Potassium 10 mEq daily. 4. Hydrochlorothiazide 25 mg daily. 5. Atenolol 100 mg daily. 6. Zocor possibly 20 mg daily. 7. Enteric-coated aspirin 325 mg a day.  ALLERGIES:  The patient has an allergy to SULFA drugs, intolerant to PRINIVIL, ACE INHIBITORS, CALCIUM CHANNEL BLOCKERS.  Please refer to or see history and physical dictation summary for social history, family history, review of systems, and physical examination.  The patient no longer smokes, drinks alcohol on occasion.  LABORATORY VALUES:  Notable for a sodium of 136, potassium 3.6, chloride of 100, CO2 of 34, glucose of 131, BUN of 14, creatinine 0.9, calcium 9.6.  Total protein 7.5, albumin 3.8, AST of 33, ALT 30.  Alkaline phosphatase of 76, total bilirubin 0.6.  INR 0.9.  White count 12.2, hemoglobin of 14.8, hematocrit 42.3 and MCV of 87.3, and platelets of 376.  Urinalysis reveals specific gravity of 1.040, pH of 7.0.  ECG reveals normal sinus rhythm, nonspecific ST and T wave abnormalities. Heart rate of 65.  HOSPITAL COURSE:  The patient has done well during the course of hospitalization.  The patient was admitted for gait disturbance which has continued.  The patient has a mild right-sided hemiataxia syndrome that is much more pronounced with ambulatory ability.  The patient has complained of some slight numbness of the right face, headaches to the right frontotemporal region.  The patient has been getting physical and occupational therapy and doing fairly well with this.  The patient, however, is felt to require some inpatient rehabilitation prior to  going home.  The patient is able to walk with a walker.  MRI scan of the brain was done showing an area of an acute stroke in the right lower medulla consistent with a posterior inferior cerebellar artery stroke.  Small vessel changes were also seen throughout the brain which were chronic in nature.  No flow is demonstrated in  the distal right vertebral artery.  Atherosclerotic narrowing of the intracranial branch were also seen.  The patient has been placed back on aspirin and Plavix and is doing well at this time.  The patient initially was treated with heparin. The patient has had a prior full stroke work-up in October 2001 and therefore Doppler study and 2-D echocardiogram were not ordered during this admission. This patient is currently alert, cooperative, moves all fours well, has good strength in all fours.  The patient has not been on Glucotrol and we will be checking CBG levels to determine whether this needs to be re-added or not. The patient will potentially be going to rehabilitation on the 26th of August 2002.  DISCHARGE MEDICATIONS: 1. Aspirin 325 mg a day. 2. Plavix 75 mg a day. 3. Tenormin currently at 50 mg daily but this will be increased to 50 mg    twice a day. 4. Hydrochlorothiazide 25 mg a day. 5. Potassium 10 mEq daily. 6. Reglan if needed for nausea.  DIET:  The patient is on a diabetic diet.  No added salt diet.  FOLLOWUP:  The patient will be followed off and on by neurology following transfer. Dictated by:   Marlan Palau, M.D. Attending Physician:  Mick Sell DD:  06/13/01 TD:  06/13/01 Job: [id number]

## 2011-03-07 NOTE — Op Note (Signed)
NAME:  Caleb Castillo, Caleb Castillo NO.:  [id number]   MEDICAL RECORD NO.:  [id number]                   PATIENT TYPE:  INP   LOCATION:  2302                                 FACILITY:  [facility]   PHYSICIAN:  Alleen Borne, M.D.               DATE OF BIRTH:  [birth year]   DATE OF PROCEDURE:  07/06/2002  DATE OF DISCHARGE:                                 OPERATIVE REPORT   PREOPERATIVE DIAGNOSIS:  Left main and severe three-vessel coronary artery  disease.   POSTOPERATIVE DIAGNOSIS:  Left main and severe three-vessel coronary artery  disease.   OPERATIVE PROCEDURES:  1. Median sternotomy.  2. Extracorporeal intubation.  3. Coronary artery bypass graft surgery x five using a left internal mammary     artery graft to the left anterior descending coronary artery, with a     saphenous vein graft to the diagonal branch of the left anterior     descending, and a sequential saphenous vein graft to the obtuse marginal     branch of the left circumflex artery, posterior descending and     posterolateral branches of the right coronary.   ATTENDING SURGEON:  Alleen Borne, M.D.   ASSISTANT:  Gwenith Daily. Tyrone Sage, M.D.   SECOND ASSISTANT:  Maple Mirza, P. A.   ANESTHESIA:  General endotracheal.   CLINICAL HISTORY:  This patient is a 65 year old gentleman with multiple  cardiac risk factors including hypertension, type 2 diabetes, remote smoking  history, and a positive family history of heart disease, as well as a  history of stroke, who presented with unstable angina.  Cardiac  catheterization showed severe left main and three-vessel coronary disease.  The left main had about 70% stenosis.  The LAD had 40% proximal and diffuse  80% mid stenosis.  There was 80% stenosis of a large diagonal branch.  The  left circumflex is occluded proximally with filling of the obtuse marginal  bicollaterals from the left.  The right coronary artery had 40% proximal and  80% mid  stenosis.  There was 80% distal stenosis.  The posterior descending  had an 80% stenosis and a posterolateral that had 80% stenosis.  Left  ventricular ejection fraction was about 40% with anterior apical  hypokinesis.  There was no gradient across the aortic valve.  After a review  of the angiogram and examination of the patient, it was felt that coronary  artery bypass graft surgery was the best treatment.  I discussed the  operative procedure with the patient, including alternatives, benefits, and  risks including bleeding, blood transfusion, infection, stroke, graft  failure, myocardial infarction, and death.  He understood and agreed to  proceed.   OPERATIVE PROCEDURE:  The patient was taken to the operating room and placed  on the table in the supine position.  After induction of general  endotracheal anesthesia, a Foley catheter was placed in the bladder  using a  sterile technique.  Then the chest, abdomen and both lower extremities were  prepped and draped in the usual sterile manner.   The chest was entered through a median sternotomy incision.  The pericardium  opened in the midline.  Examination of the heart showed good ventricular  contractility.  The ascending aorta had no palpable plaques in it.   Then the left internal mammary artery was harvested from the chest wall as a  pedicle graft.  This was a medium caliber vessel with excellent blood flow  through it.  At the same time a segment of greater saphenous vein was  harvested from the right thigh.  This vein was of medium size and good  quality.  The patient had peripheral vascular disease with an AVI of 0.56 on  the right and 0.53 on the left.   Then the patient was heparinized and when an adequate activating clotting  time was achieved, the distal ascending aorta was cannulated using a 20-  [place] aortic cannula for arterial inflow.  Venous outflow was achieved  using a two-stage venous cannula through the right  atrial appendage.  An  antegrade cardioplegia in that cannula was inserted in the aortic root.   The patient was placed on cardiopulmonary bypass and the distal coronary  arteries identified.  The LAD was a medium sized vessel.  It was diffusely  diseased.  There were several small diagonal branches that were  nongraftable.  The diagonal branch was a large graftable vessel that was  heavily diseased proximally.  The distal portion of the vessel had no  significant disease.  The left circumflex gave off a small first marginal  branch and then there was a large bifurcating marginal artery that was  graftable.  The right coronary artery had a medium-sized posterior  descending and posterolateral branches.  The posterior descending artery was  diffusely diseased in its proximal one-half and had to be grafted fairly  distally.   Then the aorta was crossclamped and 500 cc of cold blood antegrade  cardioplegia was administered in the aortic root with critical arrest of the  heart.  Systemic hypothermia to 20-degrees centigrade and topical  hypothermia with iced saline was used.  A temperature probe was placed in  the septum insulated behind the pad and the pericardium.   The first distal anastomosis was performed to the diagonal artery.  The  internal diameter of this vessel was 1.75 mm.  The conduit used was a  segment of greater saphenous vein.  The anastomosis was performed in an end-  to-side manner using continuous 7-0 Prolene suture.  Flow was noted through  the graft and was excellent.   The second distal anastomosis was performed to the obtuse marginal branch.  The internal diameter was 1.75 mm.  The conduit used was a segment of  greater saphenous vein graft.  The anastomosis was performed in a sequential  side-to-side manner using continuous 7-0 Prolene suture.  Flow was noted  through the graft and was excellent.  Then a dose of cardioplegia was given down both vein grafts and in  the aortic root.   The third distal anastomosis was performed to the posterolateral branch of  the right coronary artery.  The internal diameter of this vessel was 1.6 mm.  The conduit used was the same segment of greater saphenous vein.  The  anastomosis was performed in a sequential side-to-side manner using  continuous 7-0 Prolene suture.  Flow was noted  through the graft and was  excellent.   The fourth distal anastomosis was performed to the posterior descending  coronary artery.  This vessel was grafted distally due to the severe  proximal and mid vessel disease.  The internal diameter was 1.75 mm.  The  conduit used was the same segment of greater saphenous vein and the  anastomosis was performed in a sequential end-to-side manner using  continuous 7-0 Prolene suture.  Flow was noted through the graft and was  excellent.  Then a dose of cardioplegia was given down both vein grafts and  in the aortic root.   The fifth distal anastomosis was performed to the mid portion of the left  anterior descending coronary artery.  The internal diameter was 1.75 mm.  The conduit used was the left internal mammary graft and this was brought  through an opening in the left pericardium anterior to the phrenic nerve.  It was anastomosed to the left anterior descending in an end-to-side manner  using continuous 8-0 Prolene suture.  The pedicle was tacked to the  epicardium with 6-0 Prolene sutures.  The patient was rewarmed to 37-degrees  centigrade and the clamp removed from the mammary pedicle.  There was rapid  warming of the ventricular septum and return of spontaneous ventricular  fibrillation.  The cross clamp was removed with a time of 77 minutes and the  patient defibrillated in a sinus rhythm.   A partial occlusion clamp was placed in the aortic root and the two proximal  vein graft anastomoses were performed in end-to-side manner using continuous  6-0 Prolene suture.  The clamp was  removed, the vein grafts deaired, the  ________.  The proximal and distal anastomoses appeared hemostatic and  allowed to _________ satisfactory.  Graft marker was placed around the  proximal anastomoses.  Two temporary right ventricular and right atrial  pacing wires were passed and brought through the skin.   When the patient had rewarmed to 37-degrees centigrade, he was weaned from  cardiopulmonary bypass on no inotropic infusions.  Total bypass time 1220  minutes.  Cardiac function appeared excellent with a cardiac output of six  liters per minute.  Protamine was given and the venous and aortic cannulas  were removed without difficulty.  Hemostasis was achieved.  Four chest tubes  were placed with bilateral pleural tubes with two in the posterior  pericardium and one in the anterior mediastinum.  The pericardium was  reapproximated over the heart.  The sternum was closed with #6 stainless steel wires.  The fascia was closed with continuous #1 Vicryl suture.  The  subcutaneous tissue was closed with continuous 2-0 Vicryl and the skin with  3-0 Vicryl subcuticular closure.  The lower extremity vein harvest site was  closed in layers in a similar manner.  The sponge, needle and instrument  counts was correct according to the scrub nurse.  Dry sterile dressings were  applied over the incisions and around the chest tubes which were hook to  Pleur-evac suction.  The patient remained hemodynamically stable and was  transported to the SICU in guarded but stable condition.                                                Alleen Borne, M.D.    BKB/MEDQ  D:  07/06/2002  T:  07/07/2002  Job:  [id number]   cc:   Cardiac Cath Lab

## 2011-03-07 NOTE — Discharge Summary (Signed)
NAME:  Caleb Castillo, Caleb Castillo                         ACCOUNT NO.:  [id number]   MEDICAL RECORD NO.:  [id number]                   PATIENT TYPE:  INP   LOCATION:  5018                                 FACILITY:  [facility]   PHYSICIAN:  Jonna L. Robb Matar, M.D.            DATE OF BIRTH:  22-Mar-1946   DATE OF ADMISSION:  04/05/2004  DATE OF DISCHARGE:  04/09/2004                                 DISCHARGE SUMMARY   PRIMARY CARE PHYSICIAN:  [facility].   ORTHOPEDIST:  Dr. Almedia Balls. Norris.   FINAL DIAGNOSES:  1. Methicillin-resistant staphylococcus aureus cellulitis of the right hand.  2. Type 2 diabetes.  3. Essential hypertension.  4. Coronary artery disease status post coronary artery bypass graft.  5. Hypercholesterolemia.  6. Iron-deficiency anemia.   PROCEDURES:  April 05, 2004.  Irrigation and debridement of right-hand  infection by Dr. Almedia Balls. Norris.   ALLERGIES:  SULFA.   CODE STATUS:  Full.   HISTORY:  This 65 year old hypertensive diabetic white male does not recall  any injury to the hand.  For three days prior to admission, he noticed a  small bump or boil.  He was seen by urgent care, and the area was packed.  Over the three days prior, it became increasingly red, tender, swollen and  the patient was noted to have a deep space infection of the hand without  flexor tenosynovitis.   PHYSICAL EXAMINATION:  Notable mostly for the findings on the right hand.  The patient remained afebrile.   HOSPITAL COURSE:  The patient had the infection debrided, irrigated.  Had a  short-arm splint and was placed in elevation.  He was initially treated with  vancomycin and Ancef.  The second day it was changed to Unasyn.  The third  day, it was changed to Levaquin and gentamicin when MRSA grew out.  The MRSA  was sensitive only to Levaquin, gentamicin and vancomycin.  The patient  continued to do well with fairly rapid closure, decrease in pain and  swelling.   His diabetic control  has not been that good.  He was initially on Glucotrol  at 5 which was increased to 10 without much improvement.   LABORATORY TESTS:  A1c was 8.0, BUN 23, creatinine 1.3, WBC went from 16.4  down to 10.3.   DISPOSITION:  The patient will be discharged on Amaryl 4 mg daily and  Avandia 2 mg daily both of which will be new for him.  He will continue on  his previous medications of aspirin 325 daily, Plavix 75 daily, atenolol 50  daily, Zocor 10 daily, Niferex 150 b.i.d., and a 10-day course of Levaquin  750 mg daily.  He is to use Tylenol for pain, be on a low-fat, low-calorie  diet.  He is to put daily dry dressings on the right hand, put it in the  wrap, ACE bandage it together, keep it elevated.   We  will arrange for Home Health to monitor his nursing progress.  He will be  seen by Dr. Ranell Patrick in two to three days, and by [facility] in two weeks.  He is to contact either of those two doctors if he has any worsening.  I  have told the patient there is a possibility he may not do well on the oral  antibiotics and have to go back on IV antibiotics which he could do either  in patient or outpatient if we can get some health insurance coverage for  it.                                                Jonna L. Robb Matar, M.D.    Dorna Bloom  D:  04/09/2004  T:  04/11/2004  Job:  [id number]   cc:   Almedia Balls. Ranell Patrick, M.D.  [facility]  [address]  [address]  [place]  [place] [address]  Fax: [phone]   [facility]

## 2011-03-07 NOTE — Op Note (Signed)
NAME:  Caleb Castillo, Caleb Castillo                         ACCOUNT NO.:  [id number]   MEDICAL RECORD NO.:  [id number]                   PATIENT TYPE:  INP   LOCATION:  5018                                 FACILITY:  [facility]   PHYSICIAN:  Almedia Balls. Ranell Patrick, M.D.              DATE OF BIRTH:  Oct 08, 1946   DATE OF PROCEDURE:  04/05/2004  DATE OF DISCHARGE:                                 OPERATIVE REPORT   PREOPERATIVE DIAGNOSIS:  Right hand infection, deep.   POSTOPERATIVE DIAGNOSIS:  Right hand infection, deep.   PROCEDURE PERFORMED:  Irrigation and debridement of right hand infection.   ATTENDING SURGEON:  Almedia Balls. Ranell Patrick, M.D.   ANESTHESIA:  Axillary block anesthesia plus MAC was used.   ESTIMATED BLOOD LOSS:  Minimal.   FLUIDS REPLACED:  500 mL of crystalloid.   Instrument count was correct.  There were no complications.  Preoperative  cultures were sent, intraoperative cultures were sent, and then IV Unasyn  was administered in the recovery room.   INDICATIONS:  The patient is a 64 year old male with a three-day history of  increasing right hand pain and swelling.  The patient denies any known  trauma to the hand but has had a punctate area of fluctuance, which was  drained by urgent care and a small packing placed.  The patient has  complained of increasing erythema of pain despite the packing and I&D.  The  patient presents now for admission to the hospital and IV antibiotics.  On  orthopedic examination the patient has evidence of a deep space infection of  the hand, given palmar tenderness despite a dorsal wound.  The patient has  no evidence of flexor tenosynovitis with negative Kanavel signs.  After  discussing with the patient options for management to include surgical  treatment, the patient elected to proceed with surgical treatment.  Informed  consent was obtained.   DESCRIPTION OF OPERATION:  After an adequate level of anesthesia achieved,  the patient positioned supine  position on the operating room table, a  nonsurgical tourniquet was placed on the right proximal arm and the right  arm was sterilely prepped and draped.  A longitudinal skin incision was  created centered on the patient's punctate wound over the dorsolateral hand.  This was taken down through the subcutaneous tissues bluntly using  hemostats, all the way down to the deep tissues on the ulnar side of the  fifth metacarpal.  This was not extended over to the interspace between the  fifth and fourth metacarpals.  The extensor tendon was intact.  Debridement  was performed of any necrotic debris and thorough irrigation with multiple  liters of normal saline irrigation and multiple bags of antibiotic  irrigation.  A moistened sponge was placed as a packing to control dead  space, and then a short-arm splint.  The patient was placed in a __________  sling, elevated, and taken to the recovery  room.                                               Almedia Balls. Ranell Patrick, M.D.   SRN/MEDQ  D:  04/05/2004  T:  04/08/2004  Job:  [id number]

## 2011-03-07 NOTE — H&P (Signed)
[place]. [facility]  Patient:    Caleb Castillo, Caleb Castillo                        MRN: [id number] Adm. Date:  [id number] Attending:  Mick Sell CC:         [facility] (new patient)   History and Physical  DATE OF BIRTH:  07-27-1946  CHIEF COMPLAINT:  Falling down.  HISTORY PRESENT CONDITION:  A 65 year old right-handed single Caucasian male who has had a three to four year history of non-insulin-dependent diabetes mellitus, dislipidemia, and hypertension which was poorly controlled due to noncompliance (could not afford medicines).  Patient was seen August 11, 2000 with diaphoresis and nausea at the [facility] emergency room - we do not have details concerning that.  He was seen October 25 with complaints of no medications, and an appointment had been set up for [facility] August 24, 2000.  He was sent home on samples of Amaryl and Cozaar.  He began to complain of blurred vision and dizziness that started after taking those medicines, and assumed that he had a medication reaction when he got up today and could not walk, and fell.  Today was the first day when he was unable to walk.  It is not clear whether this has been slowly progressive or whether it happened this afternoon.  It seems unlikely, on the basis of the CT scan.  Patient had a workup tonight, including CT scan of the brain, which showed a right cerebellar ischemic stroke in the distribution of the posterior inferior cerebellar artery.  The patient has had no orthostatic blood pressure changes. He had elevated white count of 13,600 with 70% polys, normal hemoglobin and hematocrit, MCV, and platelets.  Comprehensive metabolic showed a sodium of 132, potassium 3.3, glucose 231; otherwise, was negative.  PTT and PT were normal.  CK and troponin were normal.  Patient presented, had onset of his symptoms around 5:30 when he awakened from his nap.  He was seen  at [facility] at 1810 in the triage area.  No nursing notes were made until 2120.  Dr. Ignacia Palma was aware at 2150.  I was called at 0015.  MEDICATIONS:  Patient has been on nifedipine, gemfibrozil, glipizide, and atenolol in the past.  As mentioned, he was given Amaryl and Cozaar, doses are unknown, but I presume it was 1 mg and 25 mg, respectively.  ALLERGIES TO MEDICATIONS:  None known.  REVIEW OF SYSTEMS:  Patient has not had intercurrent infections of the head and neck, lungs, GI, GU; rash; anemia; bruisability; thyroid disease.  He has not had chest pain, shortness of breath.  He has not had problems with nausea, vomiting, hematemesis, hematochezia, or melena.  He has not had hematuria, dysuria, prostatism, arthritis.  He had not had any other neurologic symptoms except those noted above.  Review of systems is otherwise negative.  FAMILY HISTORY:  Positive for cerebrovascular accident in his mother.  She had several between her 41s and 62s, and died as a result of them.  Fathers history is unknown.  Brother had cerebrovascular accident and myocardial infarction in his late 1s.  He is now 13.  SOCIAL HISTORY:  The patients last tobacco was over 15 years ago.  He drinks occasional alcohol.  He is single.  His brother (with the stroke) lives with him.  He works for a [employer].  He had an appointment with [facility]  as noted above.  PAST SURGICAL HISTORY:  Appendectomy.  PHYSICAL EXAMINATION:  VITAL SIGNS:  Blood pressure 163/98.  Orthostatic blood pressures were done and did not show abnormalities.  Resting pulse 90, rep 20, temperature 98.8.  HEENT:  No signs of infection.  NECK:  Supple, full range of motion, no cranial or cervical bruits.  LUNGS:  Clear to auscultation.  HEART:  No murmurs.  PULSES:  Normal.  ABDOMEN:  Soft, bowel sounds normal.  EXTREMITIES:  Without edema.  Patient had a wasted left calf, prior crush injury to his left  foot.  NEUROLOGIC:  Awake, alert, no dysphasia or dyspraxia.  Cranial nerves:  Round, reactive pupils.  Fundi normal.  Visual fields full to double simultaneous stimuli.  OKN responses equal bilaterally.  Symmetric facial strength. Midline tongue and uvula.  Air conduction greater than bone conduction bilaterally.  Motor:  No drift.  The patient had excellent strength.  Fine motor movements were normal.  Sensation intact to cold, vibration, stereoagnosis.  Two-point discrimination was 5 mm bilaterally.  Cerebellar:  Good finger-to-nose and rapid repetitive movements.  Gait:  Patient falls to the right.  He has broad-based gait.  His right leg is slightly externally rotated.  He cannot tandem.  Finger-to-nose was okay.  Heel-knee-shin was okay.  Deep tendon reflexes were normal in the upper extremities except the left triceps, which is absent.  Absent at both ankles, diminished at the knees.  Bilateral flexor plantar responses.  IMPRESSION: 1. Right posterior inferior cerebellar artery stroke.  I do not know if this    is embolic or thrombotic, 434.11. 2. Diabetes mellitus, non-insulin-dependent. 3. Hypokalemia. 4. Hyponatremia. 5. Essential hypertension, 404.10. 6. Dislipidemia.  PLAN: 1. We will start the patient on Amaryl 1 mg a day, atenolol 50 mg a day.  I    want to use medications that he can afford, or would be readily available. 2. The patient will be treated with IV heparin, after he has a workup for    treatable causes of stroke in young people, particularly because of his    family history, in addition to his age.  This would include protein S,    protein C, antithrombin III, anticardiolipin antibody panel,    homocysteine, sedimentation rate, factor V Leiden mutation.  The patient    will also have a fasting lipid study in the morning, as well as a basic    metabolic panel in the morning.     He will have six runs of 10 mEq of K in order to improve his potassium.     He will be given normal saline at 75 cc/hour.  Heparin will be started     nonbolus after the bloods have been drawn.  Other diagnostic tests that    will be carried out:  MRI of the brain, MRA intracranial, carotid    Doppler, 2-D echocardiogram.  Patient will be seen by physical therapy    for gait training.  I do not believe he needs occupational or speech    therapy at this time.  We will also have him seen by the diabetic    teaching service.  He also needs help with patient/family services to    get and keep an appointment at [facility].  He will be    turned over to Dr. Shireen Quan for continuity of care in the morning.     Prognosis for recovery from this is quite good. DD:  08/18/00 TD:  08/18/00  Job: [id number]

## 2011-03-07 NOTE — Discharge Summary (Signed)
[place]. [facility]  Patient:    Caleb Castillo                        MRN: [id number] Adm. Date:  [id number] Disc. Date: [id number] Attending:  Mick Sell CC:         [facility]   Discharge Summary  For complete details of chief complaint, history of present illness, past medical history, and physical examination, please refer to Dr. Carlyon Prows history and physical.  Briefly, Caleb Castillo is a 65 year old right-handed male with a 3-4 year history of non-insulin dependent diabetes mellitus and hypertension, poorly controlled due to noncompliance.  The patient was seen one week prior to admission in the emergency room with complaints of diaphoresis and nausea.  He subsequently returned and was started on Amaryl and Cozaar.  He was given an appointment at [facility].  After starting on the medications, he began complaining of blurred vision and dizziness and on the morning of admission, when he got up, he could not walk and fell.  CT scan of the brain in the emergency room showed a right cerebellar ischemic stroke in the distribution of the posteroinferior cerebellar artery.  On examination in the emergency room, blood pressure was 163/98.  There was no orthostatic drop.  General examination showed a wasting of the left calf with evidence of a prior crush injury to his left foot.  Neurologic examination, he was awake and alert without speech problems. Cranial nerve examination was unremarkable.  Motor testing showed excellent strength throughout.  Examination was most notable for his gait problems with a tendency to fall to the right, a broad-based gait.  Right leg was externally rotated.  He was unable to tandem walk.  IMPRESSION ON ADMISSION: 1. Right posteroinferior cerebellar artery stroke, ? embolic versus    thrombotic. 2. History of diabetes mellitus non-insulin dependent, hypokalemia,    hyponatremia, essential hypertension,  and dyslipidemia.  PLAN:  The plan was to start the patient on heparin, obtain studies for evaluation of a possible coagulopathy, start the patient on Amaryl and atenolol, and administer runs of intravenous potassium.  LABORATORY FINDINGS:  Admission CBC and differential showed a white cell count of 13.6, hemoglobin 13.4, hematocrit 36.7.  Platelet count 311,000. Differential showed 70% neutrophils, 22% lymphocytes, and 12% monocytes. Sedimentation rate was 13 mm per hour.  PT and PTT were 12.0 and 25 seconds respectively.  The patient was therapeutic on heparin subsequently. Antithrombin 3 factor was 132% which is slightly elevated.  Factor V Leiden mutation was negative.  Protein C total was slightly elevated at 162%; protein S total was normal at 138%; homocysteine level was normal at 6.91 micromoles per liter.  Comprehensive metabolic panel following admission showed sodium 132, potassium 3.3, glucose 231, and all other indices were within normal limits.  Repeat CMP on October 30, showed glucose 161 with other indices being normal.  CPK was 45 units per liter.  Lipid profile showed a cholesterol of 260, triglycerides 325, and an HDL of 42.  Anticardiolipin antibodies were within normal ranges.  CT scan brain on August 18, 2000, showed a subacute nonhemorrhagic infarct in the inferior right cerebellar hemisphere which was new since August 11, 2000.  There was extensive periventricular small vessel ischemic changes bilaterally.  An MRI of the brain on August 18, 2000, showed findings consistent with an early subacute right cerebellar hemispheric infarction with mild hemorrhage.  The infarction was in the right pica distribution.  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  MR angiography again demonstrated the occluded right vertebra and pica.  There was intracranial atherosclerotic changes in multiple sites including mainly the right PCA.  The EKG  showed normal sinus rhythm with nonspecific ST-T wave abnormalities.  Carotid Doppler study showed no ICA stenosis bilaterally and vertebral artery flow was antegrade.  2-D echocardiogram showed normal left ventricular function with no wall motion abnormalities.  There was mild mitral valvular regurgitation.  No likely discrete intracardiac source of emboli was apparent.  HOSPITAL COURSE:  The patient was admitted to the neurosciences unit and started on heparin.  His vital signs were fairly stable throughout this brief hospitalization with blood pressure in the range of 160/90.  The patient was ambulatory holding on to his IV pole or with other minimal assistance.  He underwent the above-noted studies without problems.  CBGs were generally in the range of 114 to 250 maximum.  The patient was evaluated by physical therapy and underwent some initial therapies while in the hospital.  The patient was seen by social services, and plans were made for follow-up with [facility].  After reviewing his work-up, heparin was discontinued, and the patient was started on Plavix plus aspirin.  On November 1, the patient was doing generally well but was still occasionally dizzy when up.  He was supplied with a cane for ambulation.  He continued to tend to drift to the right when up walking but otherwise was doing quite well.  On August 21, 2000, it was felt that the patient had arrived at maximum hospital benefit. Prior to discharge, however, he began vomiting, and discharge was placed on hold.  Later in the evening, however, he was feeling all right.  He felt that the vomiting was due to something he had eaten for lunch.  Repeat CT scan of the brain prior to discharge showed no change in the right PICA distribution stroke, and the patient was discharged to home.  FINAL DIAGNOSES: 1. Right posteroinferior cerebellar artery distribution stroke. 2. History of hypertension. 3. History of non-insulin  dependent diabetes mellitus.  DISCHARGE MEDICATIONS: 1. Amaryl 1 mg p.o. q.d. 2. Atenolol 50 mg p.o. q.d.  3. HCTZ 25 mg p.o. q.d. 4. K-Dur 10 mEq 1 q.d. 5. Plavix 75 mg p.o. q.d. 6. Coated aspirin 325 mg 1 q.d.  DISPOSITION:  The patient is discharged to home with instructions to remain out of work pending on office follow-up.  Plans were made for outpatient physical therapy or home therapy.  The patient has an appointment at [facility] on September 09, 2000.  He was instructed to follow-up with Dr. Noreene Filbert or Demetrio Lapping, P.A., in three weeks following discharge.  CONDITION ON DISCHARGE:  Improved.  PROGNOSIS:  Good. DD:  09/30/00 TD:  09/30/00 Job: [id number]

## 2011-03-07 NOTE — Consult Note (Signed)
NAME:  Caleb Castillo, RIEDESEL NO.:  [id number]   MEDICAL RECORD NO.:  [id number]          PATIENT TYPE:  INP   LOCATION:  2012                         FACILITY:  [facility]   PHYSICIAN:  Sigmund I. Patsi Sears, M.D.DATE OF BIRTH:  1946/02/23   DATE OF CONSULTATION:  12/08/2005  DATE OF DISCHARGE:                                   CONSULTATION   SUBJECTIVE:  This is a 65 year old male status post femoropopliteal bypass  graft on December 01, 2005.  The patient has had postop urinary retention,  recurrent.  He had postvoid residuals above 500 mL and 375 mL with Foley  left in place.   The patient has noted a 78-month history of nocturia x3-4, but notes that he  has a good urinary stream during the daytime and that for several years he  has had difficulty voiding after surgery.   REVIEW OF SYSTEMS:  Significant for chronic constipation, now treated with  oral medications.   PAST MEDICAL HISTORY:  Significant for:  1.  Diabetes, type 2.  2.  Hypertension.  3.  History of MI.  4.  History of elevated cholesterol.  5.  History of CVA in 2004.  6.  History of peripheral vascular disease.  7.  History of MRSA cellulitis.   PAST SURGERIES:  1.  Coronary artery bypass grafting in 2005 and 2003.  2.  Bilateral cataracts.   ALLERGIES:  1.  SULFA (RASH).  2.  PRINIVIL.  3.  ACE INHIBITORS (COUGH).  4.  CALCIUM CHANNEL BLOCKERS.   ADMISSION MEDICATIONS:  1.  Amaryl 4 mg 1 p.o. per day.  2.  Avandia 2 mg 1 p.o. per day.  3.  Aspirin 325 mg 1 p.o. per day.  4.  Plavix 75 mg 1 p.o. per day.  5.  Atenolol 50 mg 1 p.o. per day.  6.  Zocor 10 mg 1 p.o. per day.  7.  Niferex 150 mg b.i.d.   SOCIAL:  The patient is single, with no children.  He lives with his  brother.  Tobacco:  None current.  Alcohol:  None.   The patient is disabled, but does drive.   PHYSICAL EXAMINATION:  VITAL SIGNS:  Temperature is 100.3, blood pressure is  109/66, pulse is 82 and respiratory 23.  O2  sat 95% and CBG is 226.  GENERAL:  A well-developed male in mild distress.  He has obvious healing  leg incisions.  ABDOMEN:  Plus bowel sounds without organomegaly and without masses.  GENITOURINARY:  Normal male external genitalia.  The penis is normal.  Urethra is normal.  The scrotum is normal.  The testicles measure 4 x 3 cm  and nontender.  RECTAL EXAMINATION:  A definite decrease in sphincter tone.  The prostate is  4+, lobular, benign, no masses and no blood.  EXTREMITIES:  Healing leg incisions.   ASSESSMENT:  Urinary retention postoperatively probably is an extension of a  poor voiding pattern preoperatively.  The patient is a known diabetic, and  has definite decrease in sphincter tone.  We will begin with a trial of  alpha  blockers (Flomax 0.4 mg), and then check his postvoid residuals.  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  He is a poor candidate for Urecholine therapy because of his cardiac  disease.   PLAN:  We will begin Flomax at night as a trial of alpha blocker, and we  will check postvoid residuals beginning tomorrow morning again.      Sigmund I. Patsi Sears, M.D.  Electronically Signed     SIT/MEDQ  D:  12/08/2005  T:  12/09/2005  Job:  [id number]

## 2011-03-07 NOTE — H&P (Signed)
NAME:  Caleb Castillo, Caleb Castillo NO.:  [id number]   MEDICAL RECORD NO.:  [id number]          PATIENT TYPE:  INP   LOCATION:  NA                           FACILITY:  [facility]   PHYSICIAN:  Charles E. Fields, MD  DATE OF BIRTH:  06-25-1946   DATE OF ADMISSION:  12/01/2005  DATE OF DISCHARGE:                                HISTORY & PHYSICAL   ADMIT DIAGNOSIS:  Left popliteal aneurysm.   HISTORY OF PRESENT ILLNESS:  This is a 65 year old Caucasian male who has  been followed by Dr. Jettie Booze since 2004 with bilateral lower extremity pain.  The left lower extremity pain has increased over time.  He describes the  pain present in the arches of the bilateral feet with walking; however, the  left is greater than the right.  He also complains of neuropathy-type  symptoms with burning numbness and tingling in the bilateral feet.  He  denies rest pain.  On physical exam at his last appointment with Dr. 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  It was Dr. Evelina Dun opinion that the pain  was likely related to possible embolization from the aneurysm in the left  lower extremity.  ABIs were also performed on that day and revealed 0.7 on  the right and 0.75 on the left.  The patient underwent aortogram on November 25, 2005, the results of which are not available at the time of this  dictation.  The patient presents to the CVTS office today for history and  physical exam prior to left popliteal aneurysm repair.  The patient denies  rest pain, peripheral edema, erythema, ulceration, fever, chills, decreased  temperature, chest pain, shortness of breath and TIA/CVA symptoms.   PAST MEDICAL HISTORY:  1.  Diabetes mellitus.  2.  Hypertension.  3.  Myocardial infarction in the past.  4.  Hypercholesterolemia.  5.  CVA in 2004.  6.  Peripheral  vascular disease.  7.  History of MRSA cellulitis.   PAST SURGICAL HISTORY:  1.  CABG x5 in 2003 with greater saphenous vein harvested from the right      lower extremity.  2.  Bilateral cataract removal.   ALLERGIES:  SULFA, which causes a rash, PRINIVIL and ACE INHIBITORS which  cause cough, CALCIUM CHANNEL BLOCKERS.   MEDICATIONS:  1.  Amaryl 4 mg daily.  2.  Avandia 2 mg daily.  3.  Aspirin 325 mg daily.  4.  Plavix 75 mg daily.  5.  Atenolol 50 mg daily.  6.  Zocor 10 mg daily.  7.  Niferex 150 mg twice daily.   REVIEW OF SYSTEMS:  Please see HPI for significant positives and negatives,  otherwise negative for renal disease.   SOCIAL HISTORY:  This is a single male with no children who lives with his  brother.  He quit smoking many years ago and denies alcohol use.  He is  disabled; however, he does continue to drive.  The patient does have  assistance available after discharge.   FAMILY HISTORY:  Mother and siblings with diabetes mellitus.   PHYSICAL EXAMINATION:  VITAL SIGNS:  Blood pressure 124/74, left arm  sitting, heart rate 72, respirations 18.  GENERAL:  This is a 65 year old Caucasian male in no acute distress.  HEENT:  Normocephalic, atraumatic.  Pupils are equal, round and reactive to  light and accommodation.  The extraocular movements are intact.  The oral  mucosa is pink and moist as well as edentulous.  The sclerae are anicteric.  NECK:  Neck is supple with no JVD, no bruits and no lymphadenopathy.  The  carotids are palpable.  LUNGS:  Respirations are symmetric on inspiration, unlabored and clear.  CARDIAC:  Regular rate and rhythm.  No murmurs, no rubs, no gallops.  ABDOMEN:  The abdomen is soft, nontender and non-distended with normoactive  bowel sounds and obese.  GU:  Deferred.  RECTAL:  Deferred.  EXTREMITIES:  There is no evidence of edema, varicosities or venous stasis  changes.  The bilateral lower extremities are warm and there is a  venectomy  scar present on the right lower extremity.  Pulses:  Radials 2+ bilaterally,  femoral 2+ bilaterally, popliteal 1+ on the right and 3+ on the left.  Pedal  pulses are not palpable.  NEUROLOGIC:  Exam is nonfocal.  The patient is alert and oriented x3.  The  gait is steady.  Muscle strength is 5/5 throughout all extremities and  symmetric.  Deep tendon reflexes are 2+.   ASSESSMENT:  Left popliteal aneurysm.   PLAN:  Repair of left popliteal aneurysm on December 01, 2005 by Dr. Darrick Penna.  Dr. Darrick Penna has seen and evaluated this patient prior to this admission and  has explained the risks and benefits of the procedure and the patient has  agreed to continue.      Pecola Leisure, PA    ______________________________  Janetta Hora Fields, MD    AY/MEDQ  D:  11/27/2005  T:  11/28/2005  Job:  [id number]

## 2011-03-07 NOTE — Discharge Summary (Signed)
NAME:  Caleb Castillo, Caleb Castillo NO.:  [id number]   MEDICAL RECORD NO.:  [id number]          PATIENT TYPE:  INP   LOCATION:  2012                         FACILITY:  [facility]   PHYSICIAN:  Janetta Hora. Fields, MD  DATE OF BIRTH:  Jul 17, 1946   DATE OF ADMISSION:  12/01/2005  DATE OF DISCHARGE:  12/11/2005                                 DISCHARGE SUMMARY   ADMITTING DIAGNOSIS:  Left popliteal aneurysm.   PAST MEDICAL HISTORY AND DISCHARGE DIAGNOSES:  1.  Diabetes mellitus.  2.  Hypertension.  3.  Myocardial infarction in the past.  4.  Hypercholesterolemia.  5.  Cerebrovascular accident in 2004.  6.  Peripheral vascular disease.  7.  History of MRSA cellulitis.  8.  Coronary artery bypass graft x5 in 2003 with greater saphenous vein      harvested from the right lower extremity.  9.  Bilateral cataract removal.  10. Left popliteal aneurysm, status post ligation of left popliteal artery      aneurysm with left superficial-femoral-artery-to-peroneal-artery bypass      graft utilizing nonreversed translocated greater saphenous vein from the      left lower extremity.  11. Postoperative acute blood loss anemia, status post transfusion.  12. Postoperative urinary retention, treated with alpha blocker and      resolved.  13. Postoperative lower extremity cellulitis, treated with Keflex and      resolved.   ALLERGIES:  1.  SULFA.  2.  PRINIVIL.  3.  ACE INHIBITORS.  4.  CALCIUM CHANNEL BLOCKERS.   BRIEF HISTORY:  The patient is a 65 year old Caucasian male who had been  followed by Dr. Darrick Penna since 2004 with bilateral lower extremity pain.  The  left lower extremity pain had increased over time and was greater on the  left than right.  The patient began to complain of neuropathy-type symptoms  with burning, numbness and tingling in his bilateral lower extremities.  He  denied rest pain, however.  On physical exam with Dr. Darrick Penna, a prominent  left popliteal pulse was noted  and therefore an ultrasound was performed on  November 25, 2005, which revealed a left popliteal artery aneurysm of 3 x  2.76 cm with laminated thrombus.  It was Dr. Darrick Penna' opinion that the pain  was likely related to possible embolization from the aneurysm in the left  lower extremity.  ABIs were performed and revealed 0.7 on the right and 0.75  on the left.  It was Dr. Darrick Penna' opinion that the patient should proceed  with ligation of left popliteal artery aneurysm and left SFA-to-peroneal-  artery bypass graft.   HOSPITAL COURSE:  The patient was admitted and taken to the OR on December 01, 2005 for ligation of left popliteal artery aneurysm with left SFA-to-  peroneal-artery bypass graft using nonreversed translocated greater  saphenous vein to the left lower extremity.  The patient tolerated the  procedure well and was hemodynamically stable immediately postoperatively.  The patient was transferred immediately to the postanesthesia care unit in  stable condition.  The patient was extubated without complication and woke  up  from anesthesia neurologically intact.   On postoperative day 1, the patient was afebrile with stable vital signs.  He had strong DP, PT and peroneal signals.  The patient was ambulated and  transferred to unit 2000 without difficulty.   On postoperative day 2, the patient was noted to have an acute blood loss  anemia with a hemoglobin of 7.6.  He was transfused with 2 units of packed  RBCs and started on folic acid as well as Niferex.  This subsequently  resolved and the patient remained asymptomatic.  His Foley catheter was  initially discontinued and he was unable to void for several hours after  that.  The Foley was reinserted and 500 mL of urine were returned.  The  Foley remained in place for several days prior to a repeat void trial.   On postoperative day 6, the patient's Foley catheter was again removed.  He  was still unable to void and therefore a  catheter was replaced with a return  of 375 mL of clear-yellow urine.  The Urology Service was consulted and it  was their opinion that the patient should be started on an alpha blocker.  A  repeat void trial should be attempted.  The patient's Foley was again  discontinued on the evening of December 08, 2005 and by the next day, the  patient had been able to void x3.  The patient was also noted to have an  elevated white count on December 10, 2005 with some erythema surrounding the  distal incision of the left lower extremity.  The patient was therefore  started on p.o. Keflex for cellulitis.  He completed a 7-day course of  Keflex as an outpatient.   On December 11, 2005, the patient was without complaint.  He was afebrile  with stable vital signs and was voiding without difficulty.  A urinalysis  was checked and found to be negative.  On physical exam, cardiac regular  rate and rhythm, lungs were clear to auscultation and the incisions were  clean, dry and intact.  There was decreased edema present in the left lower  extremity and a 2+ palpable posterior tibial pulse on the left.  The  erythema of the distal incision had also decreased.  Home health physical  therapy had been ordered for the patient, as he was still having difficulty  with ambulation.  The patient was to complete a 7-day course of Keflex.  He  was found to be in stable condition and felt to be ready for discharge home.   LABORATORY DATA:  CBC on December 10, 2005:  White count 11, hemoglobin  11.7, hematocrit 28, platelets 453,000.  BMP on December 08, 2005:  Sodium  136, potassium 4.2, BUN 21, creatinine 1.0, glucose 176.   CONDITION ON DISCHARGE:  Stable.   INSTRUCTIONS:  1.  Diet:  Low salt, low fat.  2.  Activity:  The patient should continue daily breathing and walking      exercises.  3.  Wound care:  Shower daily and clean the incisions with soap and water.   MEDICATIONS: 1.  Amaryl 4 mg daily.  2.   Avandia 2 mg daily.  3.  Aspirin 325 mg daily.  4.  Plavix 75 mg daily.  5.  Atenolol 50 mg daily.  6.  Zocor 10 mg daily.  7.  Zyprexa 150 mg b.i.d.  8.  Tylox one to two q.4-6 h. p.r.n. pain.  9.  Folic acid 1 mg daily.  10. Keflex 500 mg b.i.d. x7 day.  11. Flomax 0.4 mg daily.   FOLLOWUP APPOINTMENT:  1.  Staple removal on December 15, 2005 at 11 a.m. with the CVTS nurse.  2.  With Dr. Darrick Penna on December 26, 2005 at 3 p.m.  3.  With Dr. Patsi Sears; the patient was instructed to contact his office      for an appointment 1-2 weeks after discharge.      Pecola Leisure, PA    ______________________________  Janetta Hora Fields, MD    AY/MEDQ  D:  02/17/2006  T:  02/18/2006  Job:  [id number]   cc:   Vonzell Schlatter. Patsi Sears, M.D.  Fax: [phone]

## 2011-03-07 NOTE — Op Note (Signed)
NAME:  Caleb Castillo, Caleb Castillo NO.:  [id number]   MEDICAL RECORD NO.:  [id number]          PATIENT TYPE:  INP   LOCATION:  NA                           FACILITY:  [facility]   PHYSICIAN:  Charles E. Fields, MD  DATE OF BIRTH:  Feb 05, 1946   DATE OF PROCEDURE:  11/25/2005  DATE OF DISCHARGE:                                 OPERATIVE REPORT   PROCEDURE:  Aortogram with bilateral lower extremity runoff.   PREOPERATIVE DIAGNOSIS:  Left popliteal aneurysm.   POSTOPERATIVE DIAGNOSIS:  Left popliteal aneurysm.   ANESTHESIA:  Local.   SURGEON:  Charles E. Fields, M.D.   ASSISTANT:  Nurse   OPERATIVE INDICATIONS:  The patient is a 65 year old male with history of  claudication type symptoms in his left and right foot.  He has a popliteal  aneurysm by ultrasound of the left leg.  He presents for diagnostic workup,  for evaluation for operative repair.   DESCRIPTION OF PROCEDURE:  After obtaining informed consent, the patient was  brought the PV lab.  The patient placed in supine position on the angio  table.  Next, her right groin was prepped and draped in the usual sterile  fashion.  Local anesthesia was infiltrated over the right common femoral  artery.  A Majestic needle was used to cannulate the right common femoral  artery, and a 0.035 J-tip guide wire threaded through the right common  femoral artery into the abdominal aorta.  The 5-French sheath was placed in  the right common femoral artery, and the pigtail catheter was placed in the  abdominal aorta.  Abdominal aortogram was obtained which shows the left and  right renal arteries are widely patent.  The abdominal aorta has mild  atherosclerotic changes.  The left and right common iliac arteries were  widely patent with mild atherosclerotic changes.  The left and right  external and internal iliac arteries are patent.  Pigtail catheter was then  pulled down near the aortic bifurcation, and a pelvic angiogram was  obtained.  This shows that the distal left and right external iliac arteries  were widely patent.  A _______ down the legs was then performed.  The left  and right common femoral arteries are ectatic.  The profunda and superficial  femoral arteries were widely patent throughout their course.  The right  popliteal artery is ectatic.  The left popliteal artery is aneurysmal just  behind the knee. There is occlusion of the posterior tibial, anterior tibial  and peroneal artery on the right and left side near their origins.  There is  reconstitution of the peroneal artery in the distal third of the leg  bilaterally.   Next, to  obtain better views of the left leg, the pigtail catheter was  pulled back over a guidewire and a crossover catheter was used to  selectively catheterize 'the left common iliac artery first and then the  catheter was advanced down into the left superficial femoral artery.  Additional magnified views of knee, leg and foot were obtained.  This shows  again reconstitution of the peroneal artery at the  lower third of the leg.  The anterior and posterior communicating branches are patent which fills the  dorsalis pedis artery.  There are collaterals in the area of the posterior  tibial artery but this was not completely visualized.  The plantar was also  not visualized.   The end-hole catheter was then pulled back and selective views of the right  lower extremity were obtained through the sheath.  Again magnified views of  the knee, leg and foot were obtained.  This shows a similar picture to the  left leg with reconstitution of the peroneal artery at the mid to lower  third of the leg with occlusion of the anterior communicating branch were  patent to the posterior communicating branch.  There is also reconstitution  of the dorsalis pedis artery on the right side, but minimal filling of the  plantar arch.  The posterior tibial artery is occluded in the foot.   Next the  sheath was removed.  Hemostasis obtained with direct pressure.  The  patient tolerated the procedure well, and there were no complications.   FINDINGS:  1.  Left popliteal artery aneurysm.  2.  Severe tibial artery occlusive disease bilaterally.  3.  Peroneal runoff to the left foot.  4.  Peroneal runoff to the right foot but with minimal filling of      communicating branches.  5.  Reconstitution of dorsalis pedis on the right foot via collaterals but      no patent plantar arch.           ______________________________  Janetta Hora. Fields, MD     CEF/MEDQ  D:  11/25/2005  T:  11/25/2005  Job:  [id number]

## 2011-03-07 NOTE — Discharge Summary (Signed)
[place]  Patient:    Caleb Castillo, Caleb Castillo Visit Number: [id number] MRN: [id number]          Service Type: Attending:  Rande Brunt. Thomasena Edis, M.D. Dictated by:   Dian Situ, PA Adm. Date:  06/14/01 Disc. Date: 06/23/01   CC:         Kelli Hope, M.D.  [facility]   Discharge Summary  DISCHARGE DIAGNOSES: 1. Status post right medullary infarct. 2. Hypertension. 3. Diabetes mellitus.  HISTORY OF PRESENT ILLNESS:  Caleb Castillo is a 65 year old male with history of hypertension, diabetes mellitus poorly controlled, admitted August 19 in the early a.m. with complaints of difficulty with ambulation.  MRI/MRA done showed acute punctate stroke in right medulla.  Old right pontine cerebellar strokes. No fluent distal right cerebral artery and small vessel disease.  He was started on heparin initially; however, has been transitioned over to aspirin/Plavix for CVA prophylaxis.  Some ataxia is noted on the right side. His headache is resolving.  He is currently at min assist for transfers, min assist supervision for ambulating 100 feet with a rolling walker.  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  ALLERGIES:  SULFA.  SOCIAL HISTORY:  The patient lives alone with brother occasionally stays with him in a mobile home with three steps at entry.  He was independent prior to admission.  He has been unemployed for the past three months.  He quit tobacco 15 years ago, does not use any alcohol.  HOSPITAL COURSE:  Caleb Castillo was admitted to rehab on June 14, 2001 for inpatient therapies to consist of PT/OT daily.  Past admission, he was maintained on aspirin/Plavix for CVA prophylaxis.  Blood sugars were monitored on a.c. h.s. basis and Glucotrol dose has been titrated for tighter control. His blood pressures have shown good control during his stay.  RD consult  was obtained to instruct the patient regarding dietary discretion in terms of diabetes, diet, and also he has been instructed regarding CBG monitoring. Labs done at admission showed hemoglobin 15.3, hematocrit 43.4, white count 11.1, platelets 305.  Sodium 135, potassium 3.8, chloride 95, CO2 29, BUN 19, creatinine 1.1, glucose 118.  ALT slightly elevated at 50.  A UA/UC was done and showed no significant growth.  The patient was noted to become intact with long-term memory being mildly impaired.  He was able to recall information using compensatory techniques.  During his stay in rehab, he progressed to being at modified independent on ______, modified independent ambulating on unit with a rolling walker.  Request supervision on even surfaces.  He does have a tendency to lean to the right with loss of balance without assistive device.  He is modified independent for ADL needs including toileting, as well as simple home management skills.  He is at modified independent level for a supervised setting; however, he requires supervision once out of the house. On June 23, 2001, the patient is discharged to home with further follow-up therapies to include home health PT/OT by [facility].  DISCHARGE MEDICATIONS: 1. Ecotrin 325 mg per day. 2. Tenormin 50 mg per day. 3. Plavix 75 mg per day. 4. Glucotrol XL 5 mg per day. 5. Hydrochlorothiazide 25 mg per day. 6. K-Dur 10 mEq per day. 7. Zocor 10 mg per day.  ACTIVITIES:  To use walker.  DIET:  Diabetic diet with low fat.  Check CBGs once a day on rotating basis.  SPECIAL INSTRUCTIONS:  No alcohol, no smoking, no driving.  FOLLOW-UP:  The patient is to follow up with Dr. Thomasena Edis on October 2 at noon for post hospital check.  Follow up with Dr. Noreene Filbert in 3-4 weeks.  Follow up with [facility] September 11 at 10 a.m. Dictated by:   Dian Situ, PA Attending:  Rande Brunt. Thomasena Edis, M.D. DD:  08/03/01 TD:   08/03/01 Job: [id number]

## 2011-03-07 NOTE — Op Note (Signed)
NAME:  HERNANDEZ, LOSASSO NO.:  [id number]   MEDICAL RECORD NO.:  [id number]          PATIENT TYPE:  INP   LOCATION:  2012                         FACILITY:  [facility]   PHYSICIAN:  Janetta Hora. Fields, MD  DATE OF BIRTH:  1945-12-28   DATE OF PROCEDURE:  12/01/2005  DATE OF DISCHARGE:                                 OPERATIVE REPORT   PROCEDURE:  1.  Left superficial femoral to peroneal artery bypass using non-reversed      left greater saphenous vein.  2.  Ligation of left popliteal aneurysm.  3.  Intraoperative arteriogram x 1.   PREOPERATIVE DIAGNOSIS:  Left popliteal aneurysm with early rest pain.   POSTOPERATIVE DIAGNOSIS:  Left popliteal aneurysm with early rest pain.   ANESTHESIA:  General.   ASSISTANT:  Di Kindle. Edilia Bo, M.D.  Jerold Coombe, P.A.-C.   INDICATIONS:  The patient is a 65 year old male with a left popliteal  aneurysm.  He has developed severe occlusive disease and early rest pain  secondary to this.  He has only peroneal runoff on the left leg.   OPERATIVE FINDINGS:  1.  Small left greater saphenous vein.  2.  Severely diseased left peroneal artery   OPERATIVE DETAIL:  After obtaining informed consent, the patient in the  operating room.  The patient placed supine position operating table.  After  induction of general anesthesia and endotracheal intubation, a Foley  catheter was placed.  Both lower extremities were prepped and draped usual  sterile fashion.  A longitudinal incision was made in the mid portion of the  leg just posterior the tibia.  Saphenous vein was dissected free in this  location.  It was just less than 2 mm in diameter.  This was reflected  posteriorly.  An incision was then carried down into the deep portion of the  leg and the soleus muscle was taken down with cautery.  The peroneal artery  was identified.  There was several overlying veins which were ligated and  divided between silk ties.  The peroneal  artery was partially calcified but  seemed to have a suitable segment for bypass grafting from the arteriogram.  Next, attention was turned to harvesting the greater saphenous vein.  The  vein was harvested from the below the knee incision up to the level of the  knee.  The vein did not increase in size much.  At the level of the there  was a bifid system.  The larger of these branches was selected and dissected  free circumferentially.  Small side branches were ligated and divided  between silk ties.  Through skip incisions up the leg, the remainder of the  greater saphenous vein was harvested all the way up to the level  saphenofemoral junction.  Next, an incision was deepened in the medial  aspect of the leg above the knee.  The incision was carried down past the  sartorius muscle which was reflected laterally.  The popliteal artery was  dissected free circumferentially.  The dissection was carried up to the  level of superficial femoral artery right at  the adductor hiatus.  A  suitable segment of artery was found above the level of the popliteal  aneurysm.  The patient was given 7000 units of intravenous heparin.  The  greater saphenous vein was then harvested by oversewing the saphenofemoral  junction with a 5-0 Prolene suture.  The vein was thoroughly flushed with  heparinized saline and inspected and found be hemostatic.  The superficial  femoral artery was then clamped after appropriate circulation time of  heparin.  It was also clamped distally.  The artery was transected and the  aneurysm oversewn proximally with a running 5-0 Prolene suture.  Next, the  artery was spatulated as well as the vein.  The vein was then sewn in a non-  reversed position end-to-end to the superficial femoral artery using a  running 5-0 Prolene suture.  Completion anastomosis was tested, several  repair sutures were placed, and it was found to be hemostatic.  Next, a the  valvulotome was brought up on  the operative field and placed up through the  vein and all the valves were lysed.  The vein was then marked for  orientation.  It was then tunneled in a deep configuration medial to the  medial head of the gastrocnemius muscle.  A tourniquet was then placed  around the patient's thigh and after the leg was exsanguinated with an  Esmarch, the tourniquet was inflated to 300 mmHg.  Total tourniquet time was  1 hour.  Next, the peroneal artery was opened at the previous dissected  portion.  The artery was essentially totally occluded at this level.  Dissection was carried further down the peroneal artery until a suitable  segment was found and the artery was probed and found to accept a 1.5 mm  dilator.  The proximal peroneal artery, which had been opened, was ligated  and the distal peroneal artery was spatulated.  Since the popliteal artery  had occluded proximally and the peroneal artery was ligated, no further  ligation was done in the lower leg. The vein was then cut to length and  spatulated.  It was then sewn end-to-end to the peroneal artery using 7-0  interrupted Prolene sutures at the toe and heel, and then running the middle  portion of the anastomosis.  At completion of the anastomosis, the  tourniquet was released.  The anastomosis was flushed and then secured.  There was good Doppler flow which was biphasic in character through the  peroneal artery distal to the anastomosis.  There was also a good biphasic  peroneal artery signal at the ankle.  Intraoperative arteriogram was  obtained with inflow occlusion.  A 21 gauge butterfly needle was introduced  into the proximal portion of the vein graft.  This showed that the distal  anastomosis was patent with single runoff via peroneal distally in the leg.  There was seen to be some spasm or a small portion of the artery near the  anastomosis.  The artery did expand to possibly as wide as 2 mm more distally in the leg.  There was good  flow all the way into the foot via the  anterior and posterior communicating branches of the peroneal artery.  Next  hemostasis was obtained.  The patient was also given 50 mg of protamine.  The patient had also been given an additional 2000 units of heparin during  the operation.  Next, the deep layers of all incisions were closed with  running Vicryl suture.  The below knee incision  had a 10-mm Jackson-Pratt  drain placed in the deep space and brought out through a separate stab  incision below the knee and sewn to the skin with nylon suture.  After the  deep layers had been closed with Vicryl suture, the skin of all incisions  was then closed staples.  The patient tolerated procedure well and there  were no complications.  Instrument, sponge, and needle counts were correct  at the end of the case.  The patient taken to recovery room in stable  condition.           ______________________________  Janetta Hora Fields, MD     CEF/MEDQ  D:  12/02/2005  T:  12/02/2005  Job:  [id number]

## 2011-03-07 NOTE — Procedures (Signed)
BYPASS GRAFT EVALUATION   INDICATION:  Followup left lower extremity bypass graft.   HISTORY:  Diabetes:  Yes.  Cardiac:  CABG.  Hypertension:  Yes.  Smoking:  Previous.  Previous Surgery:  Left superficial femoral to peroneal artery bypass  graft with left popliteal artery aneurysm ligation on 12/07/2005.   SINGLE LEVEL ARTERIAL EXAM                               RIGHT              LEFT  Brachial:                    134                138  Anterior tibial:             66                 209  Posterior tibial:            99                 Not detected  Peroneal:                                       195  Ankle/brachial index:        0.72               1.51   PREVIOUS ABI:  Date:  05/16/2009  RIGHT:  0.65  LEFT:  1.3   LOWER EXTREMITY BYPASS GRAFT DUPLEX EXAM:   DUPLEX:  Biphasic Doppler waveforms noted throughout the left lower  extremity bypass graft and its native vessels with no increase in  velocities.  Occluded left popliteal artery aneurysm measuring 2.3 x 2.6 cm with  collateral flow noted originating from the pre aneurysmal segment of the  popliteal artery.   IMPRESSION:  1. Patent left superficial femoral to peroneal artery bypass graft      with no evidence of stenosis.  2. Stable left popliteal artery aneurysm diameters, as described      above.  3. Stable bilateral ankle brachial indices, however, the ankle      pressures may be unreliable due to possible calcific vessels.  The      right toe brachial index is 0.26 and the left toe brachial index is      0.41.   ___________________________________________  Janetta Hora. Fields, MD   CH/MEDQ  D:  11/07/2009  T:  11/07/2009  Job:  [id number]

## 2011-03-07 NOTE — Cardiovascular Report (Signed)
NAME:  Caleb Castillo, Caleb Castillo NO.:  [id number]   MEDICAL RECORD NO.:  [id number]          PATIENT TYPE:  OIB   LOCATION:  2899                         FACILITY:  [facility]   PHYSICIAN:  Ricki Rodriguez, M.D.  DATE OF BIRTH:  [birth year]   DATE OF PROCEDURE:  04/10/2006  DATE OF DISCHARGE:                              CARDIAC CATHETERIZATION   PROCEDURE:  Left heart catheterization, selective coronary angiography, left  ventricular function study.   CARDIOLOGIST:  Ricki Rodriguez, M.D.   INDICATIONS FOR PROCEDURE:  This 65 year old white male had an abnormal  nuclear stress test as a part of preoperative evaluation for a back surgery.  The patient has cardiac risk factors of diabetes, hypertension, cholesterol  level elevation, known history of coronary artery disease and coronary  artery bypass graft surgery.   APPROACH:  The right femoral artery using a 4-French sheath and catheters.   COMPLICATIONS:  None.   DYE USED:  Less than 100 mL of dye was used.   HEMODYNAMIC DATA:  Left ventricular pressure:  143/10.  Aortic pressure:  143/69.   CORONARY ANATOMY:  1.  LEFT MAIN CORONARY ARTERY:  The left main coronary artery showed      proximal tapering to a distal 60% narrowing.  2.  LEFT ANTERIOR DESCENDING CORONARY ARTERY:  The left anterior descending      coronary artery showed mild proximal diffuse narrowing, followed by      severe diffuse narrowing of the mid-vessel ending into a total      occlusion.  The diagonal-I and diagonal-II vessels were diffusely      narrow.  3.  LEFT CIRCUMFLEX CORONARY ARTERY:  The left circumflex coronary artery      showed proximal diffuse narrowing, mid-vessel total occlusion.  The      ramus branch was a long narrow vessel.  The obtuse marginal branch-I had      a proximal diffuse narrowing and the obtuse marginal branch-II,      posterolateral branch and posterior descending coronary artery were not      seen.  4.  RIGHT  CORONARY ARTERY:  The right coronary artery showed aneurysmal      ostium, proximal 20%-30% narrowing, tapering into 99% narrowing at the      junction of the mid-vessel to proximal area and a trivial mid-vessel      filling with a severe narrowing, followed by 100% occlusion.  5.  SAPHENOUS VEIN GRAFT TO OBTUSE MARGINAL BRANCH-I, POSTERIOR LATERAL AND      POSTERIOR DESCENDING CORONARY ARTERIES:  These arteries were patent with      minimal disease post-insertion.  6.  SAPHENOUS VEIN GRAFT TO DIAGONAL VESSEL:  This vessel was patent without      apparent disease in the diagonal vessel.  7.  LEFT INTERNAL MAMMARY ARTERY TO LEFT ANTERIOR DESCENDING CORONARY      ARTERY:  The left internal mammary artery to left anterior descending      coronary artery was also patent, but the LAD was a narrow vessel with      pre-insertion severe disease.  LEFT VENTRICULOGRAM:  The left ventriculogram showed mild generalized  hypokinesia with an ejection fraction of 45% and a mildly dilated left  ventricle.   IMPRESSION:  1.  Severe multivessel, native vessel coronary artery disease.  2.  Mild left ventricular systolic dysfunction.  3.  Patent left internal mammary artery to left anterior descending coronary      artery.  4.  Patent saphenous vein graft to the diagonal vessel.  5.  Patent saphenous vein graft to obtuse marginal branch, posterolateral      branch and posterior descending coronary artery.   RECOMMENDATIONS:  This patient will continue his medical treatment.  Will  discontinue his Plavix in preparation for the orthopedic surgery next week,  with the usual risks.      Ricki Rodriguez, M.D.  Electronically Signed     ASK/MEDQ  D:  04/10/2006  T:  04/10/2006  Job:  [id number]

## 2011-03-07 NOTE — H&P (Signed)
[facility]  Patient:    Caleb Castillo, Caleb Castillo Visit Number: [id number] MRN: [id number]          Service Type: MED Location: [id number] Attending Physician:  Mick Sell Adm. Date:  [id number]   CC:         [facility]   History and Physical  DATE OF BIRTH: 10/13/1946  CHIEF COMPLAINT: Cannot walk, feels woozy and drunk, with headache.  HISTORY OF PRESENT ILLNESS: Caleb Castillo is a 65 year old gentleman, who I know from prior hospital admission August 18, 2000.  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  He had problems with his gait similar to his complaints now.  Last night around 6 p.m. he had inability to walk without assistance.  He was seen this morning at [facility] and sent to [facility] for a CT scan, which showed no acute lesion, and then was sent to [facility] Emergency Room.  He has had no progression of his symptoms since that time.  The patient has complained bitterly of a right-sided headache, although it seems to be easing off.  He denies diplopia, dysphagia, dysarthria, vertigo, tinnitus, true weakness, incontinence, syncope, or seizures.  REVIEW OF SYSTEMS: GENERAL: No history of fever, rash, infection, or bleeding dyscrasia.  The patient has not had significant problems with osteoarthritis.  He does have problems with non-insulin dependent diabetes mellitus of four years duration, hypertension of many years duration, and dyslipidemia.  These were identified and addressed on his last hospitalization.  Indeed, they had been addressed previously but he had not been compliant with medication because he could not afford it.  PAST MEDICAL HISTORY: See above.  PAST SURGICAL HISTORY: Negative.  CURRENT MEDICATIONS:  1. Atenolol 50 mg q.d.  2. Hydrochlorothiazide 25 mg q.d.  3. K-Dur 10 mEq q.d.  4. Plavix  75 mg q.d.  5. Enteric-coated aspirin 325 mg q.d.  6. Zocor, I think 20 mg q.d. but he does not have his medicines with him.  7. Amaryl 1 mg q.d.  He does not admit that as a drug.  ALLERGIES: SULFA (rash).  INTOLERANCES:  1. PRINIVIL (cough).  2. ACE INHIBITOR (cough).  3. CALCIUM CHANNEL BLOCKERS (impotence).  FAMILY HISTORY: Both mother and brother have diabetes, mother insulin-dependent; brother non-insulin dependent.  SOCIAL HISTORY: The patient quit smoking 15 years ago.  He drinks occasional alcohol.  He lives with his brother.  PHYSICAL EXAMINATION:  GENERAL: This is a pleasant gentleman, sitting in bed and in no acute distress.  VITAL SIGNS: Temperature 97.5 degrees, blood pressure 136/91 right arm sitting, pulse 78, respirations 20.  Pulse oximetry 97%.  HEENT: No signs of infection.  NECK: No bruits.  LUNGS: Clear to auscultation.  HEART: No murmurs.  Pulses normal.  ABDOMEN: Soft, nontender.  Bowel sounds normal.  EXTREMITIES: Well formed without edema, cyanosis, altered tone, or tight heel cords.  NEUROLOGIC: Mental status, the patient is awake and alert.  No dysphasia or dyspraxia.  Cranial nerves, round and reactive pupils.  Fundi normal.  Visual fields full to double simultaneous stimuli.  OKN responses equal.  Symmetric facial strength.  Midline tongue and uvula.  Air conduction greater than bone conduction bilaterally.  Motor examination, no drift.  Normal strength.  Hip flexors are 4-4+/5.  Fine motor movements okay.  Sensory examination okay.  Intact stereognosis. Cerebellar examination, finger-to-nose and rapid repetitive movements okay. Gait, the patient falls  to the right - especially with tandem.  The patient has a very broad-based gait.  He can get up on his toes.  He has significant retropulsion on his heels and I have to hold him or he would fall.  Deep tendon reflexes were normal without localization.  The patient had bilateral flexor  and plantar responses.  LABORATORY DATA: I have reviewed the MRI scan and it shows a small lesion in the dorsolateral aspect of the right medulla.  I can see the old cerebellar distribution infarction.  I can also see multiple other small vessel infarctions that have been seen previously involving the left central pons, both cerebellar hemispheres, the right thalamus (actually both thalami), right and left lenticular nuclei, and bilateral centrum semiovale.  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  Dopplers on the patients last visit were fine.  Echocardiogram showed a 55-65% ejection fraction with no wall abnormalities and no source emboli.  Work-up for treatable causes in a young person included an antithrombin III of 132, protein S 162, protein S 138.  All these were slightly high but not significant.  Factor 5 Leiden was negative.  Homocystine 6.91, also normal. Anticardiolipin antibodies normal.  IgM (6), IgA (14).  Lipid panel, cholesterol 260, triglyceride 325, HDL 42, LDL 153, ratio 6.2.  Glucose 231. Sedimentation rate 13.  (This was the previous work-up other than the MRI that I mentioned).  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  This may very well be a watershed from the anterior/inferior cerebellar artery.  The patient has bilateral small vessel disease.  It is widespread, as noted above.  The MRA seems unchanged.  PLAN: We will admit the patient and work to get him to rehab.  I see no reason to change his medicines.  Will treat his headache with Tylenol.  I see no reason to go through the large-scale work-up that was done previously other than the MRI/MRA that has been done.  The patient will be placed on the stroke service and followed by Dr. Anne Hahn during the acute course of his hospitalization.  He is to continue to take his medicines for  secondary stroke  prophylaxis.  I emphasized that again today, that even is he did things in a compliant fashion, that it might not make a difference in the long-run.  I appreciate the opportunity to see him.  If you have questions or I can be of assistance, do not hesitate to contact me. DD:  06/07/01 TD:  06/08/01 Job: [id number]

## 2011-03-07 NOTE — Op Note (Signed)
NAME:  Caleb Castillo, DIALS NO.:  [id number]   MEDICAL RECORD NO.:  [id number]          PATIENT TYPE:  INP   LOCATION:  1424                         FACILITY[facility]  [facility]   PHYSICIAN:  Georges Lynch. Gioffre, M.D.DATE OF BIRTH:  05-Oct-1946   DATE OF PROCEDURE:  04/20/2006  DATE OF DISCHARGE:                                 OPERATIVE REPORT   PREOPERATIVE DIAGNOSIS:  1.  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  2.  Spinal stenosis at L2-L3.  3.  Spinal stenosis at L3-L4.  4.  Spinal stenosis at L4-L5.   POSTOPERATIVE DIAGNOSIS:  1.  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  2.  Spinal stenosis at L2-L3.  3.  Spinal stenosis at L3-L4.  4.  Spinal stenosis at L4-L5.   SURGEON:  Dr. Darrelyn Hillock   ASSISTANT:  Dr. Marlowe Kays M.D.   OPERATION:  1.  Microdiskectomy at L2-L3 a left.  2.  Central decompressive lumbar laminectomy at L2-L3.  3.  Decompressive lumbar laminectomy at L3-L4.  4.  Decompressive lumbar lamina.  Laminectomy at L4-L5.  5.  Foraminotomies at all three levels bilaterally.   PROCEDURE:  Under general anesthesia, routine orthopedic prep and draping  the lower back carried out.  This time two needles were placed back for  localization purposes.  X-ray was taken.  Note highlight:  This patient had  marked weakness of his dorsiflexors of both feet and actually motor weakness  of his lower extremities because of the stenosis and a disk herniation.   X-ray verified our position.  We then made an incision over the L2-L3, L3-  L4, L4-L5 interspaces.  Bleeders identified and cauterized.  The muscle was  stripped from the lamina and spinous processes bilaterally.  Two Kocher  clamps were placed on the lamina and an x-ray was taken to verify our  position at L2-L3 and L3-L4.  We then inserted our McCullough retractors.  Good hemostasis was maintained.  We then removed the spinous processes of L2-  L3 and L4.  We thoroughly decompressed the spinal canal at  L2-L3, L3-L4, L4-  L5 in usual fashion by performing laminectomies.  Great care was taken at  all times to protect the dura.  Note the ligamentum flavum was markedly  thickened at all three levels and we did decompress the lateral recesses and  preserved the facettes as well as we could and did foraminotomies.  Final  attention was then directed to the L2-L3 space on the left. A instrument was  placed at the disk level.  An x-ray was taken to verify our position.  Lateral recess veins were cauterized.  We did a partial facetectomy at this  level because of the marked overgrowth of bone.  We nicely exposed the dura  and root.  We then made a cruciate incision in the posterior longitudinal  ligament and  did a complete diskectomy. The disk material was very  degenerated.  We removed a few large fragments and went in the space and  cleaned out the main part of space.  When this was completed  we then  utilized the nerve hook and the Epstein curettes to make sure there were no  other loose fragments in the subligamentous space.  We went out laterally  into the foramen as well.  We had a nice decompression.  Thoroughly  irrigated out the area. Loosely applied some thrombin-soaked Gelfoam, closed  the wound in layers in usual fashion except the deep distal and proximal  part of the wound was partially left open for drainage purposes.  I injected  20 mL 0.5% Marcaine with epinephrine in the muscle.  The main part of the  wound was closed in layers in usual fashion.  Skin was closed  with metal  staples and sterile Neosporin dressing was applied.  The patient had 1 gram  of IV Ancef preop.   Note:  He had a history of MRSA in the past and we decided to put him on  vancomycin postop.           ______________________________  Georges Lynch. Darrelyn Hillock, M.D.     RAG/MEDQ  D:  04/20/2006  T:  04/20/2006  Job:  [id number]

## 2011-03-07 NOTE — Cardiovascular Report (Signed)
   NAME:  Caleb Castillo, RENBARGER NO.:  [id number]   MEDICAL RECORD NO.:  [id number]                   PATIENT TYPE:  INP   LOCATION:  3740                                 FACILITY:  [facility]   PHYSICIAN:  Noralyn Pick. Eden Emms, M.D. [facility]           DATE OF BIRTH:  05-21-1946   DATE OF PROCEDURE:  DATE OF DISCHARGE:                              CARDIAC CATHETERIZATION   PROCEDURE:  Coronary arteriography.   INDICATION:  A 65 year old diabetic with abnormal Cardiolite study  suggesting multivessel disease.   RESULTS:  Left main coronary artery had a 70% distal stenosis.   Left anterior descending artery had 40% multiple discrete lesions  proximally. There was diffuse 80% disease in the mid and distal vessel. The  first diagonal branch had a long 80% tubular lesion.   The circumflex coronary artery is 100% occluded proximally.  The first  obtuse marginal branch and AV groove branch filled by left to left  collaterals.   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  There was 80% diffuse disease in the mid and distal vessel.   There was also 80% diffuse disease in the posterolateral system and PDA.   RIGHT ANTERIOR OBLIQUE VENTRICULOGRAPHY:  RAO ventriculography showed  anteroapical wall hypokinesis.  EF was in the 40% range.  There was no  gradient across the aortic valve and no MR.  LV pressure was 158/24, aortic  pressure is 159/80.   IMPRESSION:  The patient will be referred for coronary artery bypass  grafting.                                                         Noralyn Pick. Eden Emms, M.D. [facility]    PCN/MEDQ  D:  06/30/2002  T:  07/01/2002  Job:  [id number]   cc:   Daymon Larsen, M.D. [facility]

## 2011-11-12 ENCOUNTER — Emergency Department (HOSPITAL_COMMUNITY): Payer: Medicare Other

## 2011-11-12 ENCOUNTER — Encounter (HOSPITAL_COMMUNITY): Payer: Self-pay

## 2011-11-12 ENCOUNTER — Emergency Department (HOSPITAL_COMMUNITY)
Admission: EM | Admit: 2011-11-12 | Discharge: 2011-11-12 | Disposition: A | Discharge status: Home / Self Care | Payer: Medicare Other | Attending: Emergency Medicine | Admitting: Emergency Medicine

## 2011-11-12 DIAGNOSIS — E785 Hyperlipidemia, unspecified: Secondary | ICD-10-CM | POA: Insufficient documentation

## 2011-11-12 DIAGNOSIS — M6580 Other synovitis and tenosynovitis, unspecified site: Secondary | ICD-10-CM | POA: Insufficient documentation

## 2011-11-12 DIAGNOSIS — M25569 Pain in unspecified knee: Secondary | ICD-10-CM | POA: Insufficient documentation

## 2011-11-12 DIAGNOSIS — M76899 Other specified enthesopathies of unspecified lower limb, excluding foot: Secondary | ICD-10-CM

## 2011-11-12 DIAGNOSIS — E119 Type 2 diabetes mellitus without complications: Secondary | ICD-10-CM | POA: Insufficient documentation

## 2011-11-12 DIAGNOSIS — W010XXA Fall on same level from slipping, tripping and stumbling without subsequent striking against object, initial encounter: Secondary | ICD-10-CM | POA: Insufficient documentation

## 2011-11-12 DIAGNOSIS — Z7982 Long term (current) use of aspirin: Secondary | ICD-10-CM | POA: Insufficient documentation

## 2011-11-12 DIAGNOSIS — M25469 Effusion, unspecified knee: Secondary | ICD-10-CM | POA: Insufficient documentation

## 2011-11-12 DIAGNOSIS — Z79899 Other long term (current) drug therapy: Secondary | ICD-10-CM | POA: Insufficient documentation

## 2011-11-12 DIAGNOSIS — M25461 Effusion, right knee: Secondary | ICD-10-CM

## 2011-11-12 DIAGNOSIS — I10 Essential (primary) hypertension: Secondary | ICD-10-CM | POA: Insufficient documentation

## 2011-11-12 HISTORY — DX: Unspecified osteoarthritis, unspecified site: M19.90

## 2011-11-12 HISTORY — DX: Essential (primary) hypertension: I10

## 2011-11-12 HISTORY — DX: Hyperlipidemia, unspecified: E78.5

## 2011-11-12 MED ORDER — HYDROCODONE-ACETAMINOPHEN 5-325 MG PO TABS: ORAL_TABLET | ORAL | Status: DC

## 2011-11-12 MED ORDER — DOCUSATE SODIUM 100 MG PO CAPS: 100.0000 mg | ORAL_CAPSULE | Freq: Two times a day (BID) | ORAL | Status: AC

## 2011-11-12 NOTE — ED Notes (Signed)
Pt. Fell last Friday during the snow, onto his rt. Knee.  Denies hitting his head.  Pain has increased to the rt. Knee over the last few days.   Rt. Knee is swollen, pt. Was unable to stand on that leg this am

## 2011-11-12 NOTE — ED Provider Notes (Cosign Needed)
History     CSN: [id number]  Arrival date & time 11/12/11  4098   First MD Initiated Contact with Patient 11/12/11 [phone]      Chief Complaint  Patient presents with  . Fall    (Consider location/radiation/quality/duration/timing/severity/associated sxs/prior treatment) HPI Comments: Patient reports that he slipped and fell the day after it snowed which was about 9 days ago. Patient reports that he fell onto his right knee. Patient reports that his knee was starting to feel better but this morning when he woke up he felt that his knee was much more swollen, there is an area of swelling and redness on the medial portion of his knee and the patient had severe pain with trying to bear weight. The patient is able to ambulate using his cane, but again pain is much worse with bearing weight. The patient was able to drive himself here to the emergency department for evaluation. Patient reports that he has a history of arthritis but mainly in his hands. He has a history of a fem-popliteal bypass to his right lower extremity.    Patient is a 66 y.o. male presenting with fall. The history is provided by the patient.  Fall Pertinent negatives include no fever and no numbness.    Past Medical History  Diagnosis Date  . Diabetes mellitus   . Hypertension   . Hyperlipidemia   . Arthritis     Past Surgical History  Procedure Date  . Femoral-peroneal bypass graft     History reviewed. No pertinent family history.  History  Substance Use Topics  . Smoking status: Former Games developer  . Smokeless tobacco: Not on file  . Alcohol Use: No      Review of Systems  Constitutional: Negative for fever and chills.  Musculoskeletal: Positive for joint swelling and arthralgias. Negative for back pain.  Skin: Positive for color change. Negative for rash and wound.  Neurological: Negative for weakness and numbness.    Allergies  Sulfonamide derivatives  Home Medications   Current Outpatient Rx    Name Route Sig Dispense Refill  . ASPIRIN EC 81 MG PO TBEC Oral Take 81 mg by mouth daily.    . OMEGA-3 FATTY ACIDS 1000 MG PO CAPS Oral Take 2 g by mouth daily.    Marland Kitchen OVER THE COUNTER MEDICATION Oral Take 1 tablet by mouth daily. Vitamin b-12    . DOCUSATE SODIUM 100 MG PO CAPS Oral Take 1 capsule (100 mg total) by mouth 2 (two) times daily. 30 capsule 0  . HYDROCODONE-ACETAMINOPHEN 5-325 MG PO TABS  1-2 tablets po q 6 hours prn moderate to severe pain 20 tablet 0    BP 133/84  Pulse 100  Temp(Src) 97.2 F (36.2 C) (Oral)  Resp 16  Ht 5\' 6"  (1.676 m)  Wt 180 lb (81.647 kg)  BMI 29.05 kg/m2  SpO2 99%  Physical Exam  Nursing note and vitals reviewed. Constitutional: He appears well-developed and well-nourished.  HENT:  Head: Normocephalic.  Musculoskeletal:       Right knee: He exhibits swelling. He exhibits no effusion, no ecchymosis, no deformity, no LCL laxity and no MCL laxity.       Legs: Neurological: He is alert.  Skin: Skin is warm and dry. No rash noted. No erythema. No pallor.  Psychiatric: He has a normal mood and affect.    ED Course  Procedures (including critical care time)  Labs Reviewed - No data to display Dg Knee Complete 4 Views Right  11/12/2011  *RADIOLOGY REPORT*  Clinical Data: Medial right knee pain since a slip and fall 1 week ago.  RIGHT KNEE - COMPLETE 4+ VIEW  Comparison: None.  Findings: There is no fracture or dislocation.  There is a small joint effusion and appears to be some swelling in the region of the distal quadriceps tendon.  Does the patient have tenderness or defect in the region of the distal quadriceps?  There is calcification at the quadriceps insertion on the upper pole of the patella, not felt to be significant.  IMPRESSION: No acute osseous abnormality.  Small joint effusion with soft tissue swelling in the region of the distal quadriceps tendon.  Original Report Authenticated By: Gwynn Burly, M.D.     1. Quadriceps tendonitis    2. Knee effusion, right       MDM   I reviewed the patient's knee x-rays myself. Per the radiologist, there is a small effusion noted which I did not appreciate clinically. There is some swelling that I noted new the distal medial quadriceps had which is noted on the plain film as well. The patient may have a tendon injury in that area which would result in his current symptoms. The patient will need followup with an orthopedist. My plan is to provide him with a knee immobilizer and a prescription for analgesics for comfort.  Pt is able to extend at his knee, however, pain is worse when he gets close to straight line extension.  Pt reports he has a walker at home.          Gavin Pound. Oletta Lamas, MD 11/12/11 1610

## 2011-11-12 NOTE — Discharge Instructions (Signed)
 Knee Effusion The medical term for having fluid in your knee is effusion. This is often due to an internal derangement of the knee. This means something is wrong inside the knee. Some of the causes of fluid in the knee may be torn cartilage, a torn ligament, or bleeding into the joint from an injury. Your knee is likely more difficult to bend and move. This is often because there is increased pain and pressure in the joint. The time it takes for recovery from a knee effusion depends on different factors, including:   Type of injury.   Your age.   Physical and medical conditions.   Rehabilitation Strategies.  How long you will be away from your normal activities will depend on what kind of knee problem you have and how much damage is present. Your knee has two types of cartilage. Articular cartilage covers the bone ends and lets your knee bend and move smoothly. Two menisci, thick pads of cartilage that form a rim inside the joint, help absorb shock and stabilize your knee. Ligaments bind the bones together and support your knee joint. Muscles move the joint, help support your knee, and take stress off the joint itself. CAUSES  Often an effusion in the knee is caused by an injury to one of the menisci. This is often a tear in the cartilage. Recovery after a meniscus injury depends on how much meniscus is damaged and whether you have damaged other knee tissue. Small tears may heal on their own with conservative treatment. Conservative means rest, limited weight bearing activity and muscle strengthening exercises. Your recovery may take up to 6 weeks.  TREATMENT  Larger tears may require surgery. Meniscus injuries may be treated during arthroscopy. Arthroscopy is a procedure in which your surgeon uses a small telescope like instrument to look in your knee. Your caregiver can make a more accurate diagnosis (learning what is wrong) by performing an arthroscopic procedure. If your injury is on the inner  margin of the meniscus, your surgeon may trim the meniscus back to a smooth rim. In other cases your surgeon will try to repair a damaged meniscus with stitches (sutures). This may make rehabilitation take longer, but may provide better long term result by helping your knee keep its shock absorption capabilities. Ligaments which are completely torn usually require surgery for repair. HOME CARE INSTRUCTIONS  Use crutches as instructed.   If a brace is applied, use as directed.   Once you are home, an ice pack applied to your swollen knee may help with discomfort and help decrease swelling.   Keep your knee raised (elevated) when you are not up and around or on crutches.   Only take over-the-counter or prescription medicines for pain, discomfort, or fever as directed by your caregiver.   Your caregivers will help with instructions for rehabilitation of your knee. This often includes strengthening exercises.   You may resume a normal diet and activities as directed.  SEEK MEDICAL CARE IF:   There is increased swelling in your knee.   You notice redness, swelling, or increasing pain in your knee.   An unexplained oral temperature above 102 F (38.9 C) develops.  SEEK IMMEDIATE MEDICAL CARE IF:   You develop a rash.   You have difficulty breathing.   You have any allergic reactions from medications you may have been given.   There is severe pain with any motion of the knee.  MAKE SURE YOU:   Understand these instructions.  Will watch your condition.   Will get help right away if you are not doing well or get worse.  Document Released: 12/27/2003 Document Revised: 06/18/2011 Document Reviewed: 03/01/2008 [facility] Patient Information 2012 [place], [place].     FOLLOW UP WITH YOUR OWN ORTHOPEDIST OR DR. HEWITT LISTED ABOVE EARLY NEXT WEEK FOR A RECHECK IF NEEDED.  YOU HAVE A MUSCLE/TENDON INJURY AS WELL AS A SMALL AMOUNT OF FLUID IN YOUR KNEE CAUSING THE PAIN.  KEEP YOUR KNEE  ELEVATED WHEN AT REST, USE ICE PACKS FOR 15 MINUTES THREE TIMES PER DAY FOR 3 DAYS.  TAKE 200 MG OF IBUPROFEN TWICE DAILY WITH FOOD TO HELP WITH SWELLING.  NORCO IS A NARCOTIC THAT CAN CAUSE DROWSINESS AND SOMETIMES CONSTIPATION, SO USE WITH CAUTION.

## 2011-11-12 NOTE — Progress Notes (Signed)
Orthopedic Tech Progress Note Patient Details:  Caleb Castillo [birth year] [id number]  Other Ortho Devices Type of Ortho Device: Knee Immobilizer Ortho Device Location: right leg Ortho Device Interventions: Application   Gaye Pollack 11/12/2011, 9:46 AM

## 2012-02-25 ENCOUNTER — Ambulatory Visit (INDEPENDENT_AMBULATORY_CARE_PROVIDER_SITE_OTHER): Payer: Medicare Other | Admitting: *Deleted

## 2012-02-25 ENCOUNTER — Encounter (INDEPENDENT_AMBULATORY_CARE_PROVIDER_SITE_OTHER): Payer: Medicare Other | Admitting: *Deleted

## 2012-02-25 DIAGNOSIS — Z48812 Encounter for surgical aftercare following surgery on the circulatory system: Secondary | ICD-10-CM

## 2012-02-25 DIAGNOSIS — I724 Aneurysm of artery of lower extremity: Secondary | ICD-10-CM

## 2012-03-04 NOTE — Procedures (Unsigned)
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  HISTORY: Diabetes:  Yes. Cardiac:  No. Hypertension:  Yes. Smoking:  Previous. Previous Surgery:  Left distal SFA-to-popliteal graft 12/07/2005.  SINGLE LEVEL ARTERIAL EXAM                              RIGHT              LEFT Brachial: Anterior tibial: Posterior tibial: Peroneal: Ankle/brachial index:  PREVIOUS TBI:  Date: 02/2011  RIGHT:  0.32  LEFT:  0.47  Today's TBI:  RIGHT 0.10, LEFT 0.39  LOWER EXTREMITY BYPASS GRAFT DUPLEX EXAM:  DUPLEX: 1. Widely patent left distal SFA-to-popliteal graft without evident of     stenosis. 2. The mid to distal left SFA is ectatic just proximal to the graft     anastomosis measuring approximately 1.5 cm. 3. Of note, the right TBI has decreased significantly since previous     exam into the critical range. 4. Dr. Edilia Bo was notified of results in clinic who asked that the     patient make an office visit appointment to discuss his results.  IMPRESSION:  Widely patent left distal superficial femoral-to-popliteal arterial graft without evidence of stenosis; however, the native superficial femoral artery proximal to the graft is ectatic as described above.  ___________________________________________ Janetta Hora. Fields, MD  LT/MEDQ  D:  02/25/2012  T:  02/25/2012  Job:  [id number]

## 2012-03-17 ENCOUNTER — Encounter: Payer: Self-pay | Admitting: Vascular Surgery

## 2012-03-18 ENCOUNTER — Encounter: Payer: Self-pay | Admitting: Vascular Surgery

## 2012-03-18 ENCOUNTER — Ambulatory Visit (INDEPENDENT_AMBULATORY_CARE_PROVIDER_SITE_OTHER): Payer: Medicare Other | Admitting: Vascular Surgery

## 2012-03-18 VITALS — BP 134/77 | HR 88 | Temp 98.1°F | Ht 66.0 in | Wt 182.0 lb

## 2012-03-18 DIAGNOSIS — I70219 Atherosclerosis of native arteries of extremities with intermittent claudication, unspecified extremity: Secondary | ICD-10-CM | POA: Insufficient documentation

## 2012-03-18 NOTE — Progress Notes (Signed)
[facility] [place] HISTORY AND PHYSICAL   History of Present Illness:  Patient is a 66 y.o. year old male who presents for evaluation of dilation of his left superficial femoral artery above his bypass. This is been asymptomatic. This was noted on routine duplex scanning. He states occasionally his left leg "collapses" but he does not really describe claudication he denies rest pain. He states his diabetes has been well controlled..  Other medical problems include hypertension hyperlipidemia and arthritis. These are also controlled.  Past Medical History  Diagnosis Date  . Diabetes mellitus   . Hypertension   . Hyperlipidemia   . Arthritis     Past Surgical History  Procedure Date  . Femoral-peroneal bypass graft      Social History History  Substance Use Topics  . Smoking status: Former Smoker    Types: Cigarettes    Quit date: 03/18/2002  . Smokeless tobacco: Never Used  . Alcohol Use: No    Family History History reviewed. No pertinent family history.  Allergies  Allergies  Allergen Reactions  . Sulfonamide Derivatives     REACTION: rash     Current Outpatient Prescriptions  Medication Sig Dispense Refill  . aspirin EC 81 MG tablet Take 81 mg by mouth daily.      Marland Kitchen atorvastatin (LIPITOR) 10 MG tablet Take 20 mg by mouth daily.      . clopidogrel (PLAVIX) 75 MG tablet Take 75 mg by mouth daily.      . fish oil-omega-3 fatty acids 1000 MG capsule Take 2 g by mouth daily.      Marland Kitchen glimepiride (AMARYL) 2 MG tablet Take 2 mg by mouth daily before breakfast.      . HYDROcodone-acetaminophen (NORCO) 5-325 MG per tablet 1-2 tablets po q 6 hours prn moderate to severe pain  20 tablet  0  . hydrOXYzine (ATARAX/VISTARIL) 25 MG tablet Take 25 mg by mouth 3 (three) times daily as needed. For itching      . lisinopril-hydrochlorothiazide (PRINZIDE,ZESTORETIC) 20-12.5 MG per tablet Take 1 tablet by mouth daily.      . memantine (NAMENDA) 10 MG tablet Take  10 mg by mouth 2 (two) times daily.      . metFORMIN (GLUCOPHAGE) 500 MG tablet Take 1,000 mg by mouth 2 (two) times daily with a meal.      . metoprolol tartrate (LOPRESSOR) 25 MG tablet Take 25 mg by mouth 2 (two) times daily.      Marland Kitchen OVER THE COUNTER MEDICATION Take 1 tablet by mouth daily. Vitamin b-12        ROS:   General:  No weight loss, Fever, chills  HEENT: No recent headaches, no nasal bleeding, no visual changes, no sore throat  Neurologic: No dizziness, blackouts, seizures. No recent symptoms of stroke or mini- stroke. No recent episodes of slurred speech, or temporary blindness.  Cardiac: No recent episodes of chest pain/pressure, no shortness of breath at rest.  No shortness of breath with exertion.  Denies history of atrial fibrillation or irregular heartbeat  Vascular: No history of rest pain in feet.  No history of claudication.  No history of non-healing ulcer, No history of DVT     Physical Examination  Filed Vitals:   03/18/12 1131  BP: 134/77  Pulse: 88  Temp: 98.1 F (36.7 C)  TempSrc: Oral  Height: 5\' 6"  (1.676 m)  Weight: 182 lb (82.555 kg)    Body mass index is 29.38 kg/(m^2).  General:  Alert and oriented, no acute distress HEENT: Normal Neck: No bruit or JVD Pulmonary: Clear to auscultation bilaterally Cardiac: Regular Rate and Rhythm without murmur Skin: No rash, no ulcer Extremity Pulses:  2+ radial, brachial, femoral, absent dorsalis pedis, posterior tibial pulses bilaterally feet are pink and warm Musculoskeletal: No deformity or edema  Neurologic: Upper and lower extremity motor 5/5 and symmetric  DATA: I reviewed his duplex exam which shows some ectasia of the superficial femoral artery above the level of his bypass. The artery is 1.5 cm in diameter. 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 This  will warrant continued surveillance. If the screws more than 2 cm in diameter we would consider repair of this. His toe pressures to decline somewhat. This is most likely due to small vessel disease from his diabetes. He is currently asymptomatic with no rest pain and ulcerations or claudication symptoms. We will continue observation for now.   PLAN:  Followup duplex scan of lower extremities in 1 year. The patient will return sooner if she becomes symptomatic.  Fabienne Bruns, MD [facility] [place] Office: [phone] Pager: [phone]

## 2012-07-15 ENCOUNTER — Other Ambulatory Visit: Payer: Self-pay | Admitting: Cardiovascular Disease

## 2012-07-15 DIAGNOSIS — M542 Cervicalgia: Secondary | ICD-10-CM

## 2012-07-19 ENCOUNTER — Other Ambulatory Visit: Payer: Medicare Other

## 2012-07-20 ENCOUNTER — Inpatient Hospital Stay: Admission: RE | Admit: 2012-07-20 | Payer: Medicare Other | Source: Ambulatory Visit

## 2013-03-15 ENCOUNTER — Other Ambulatory Visit: Payer: Self-pay | Admitting: *Deleted

## 2013-03-15 DIAGNOSIS — Z48812 Encounter for surgical aftercare following surgery on the circulatory system: Secondary | ICD-10-CM

## 2013-03-15 DIAGNOSIS — I739 Peripheral vascular disease, unspecified: Secondary | ICD-10-CM

## 2013-03-17 ENCOUNTER — Ambulatory Visit: Payer: Medicare Other | Admitting: Neurosurgery

## 2013-12-24 ENCOUNTER — Ambulatory Visit (INDEPENDENT_AMBULATORY_CARE_PROVIDER_SITE_OTHER): Payer: Medicare Other | Admitting: Family Medicine

## 2013-12-24 VITALS — BP 138/70 | HR 103 | Temp 97.4°F | Resp 16

## 2013-12-24 DIAGNOSIS — I739 Peripheral vascular disease, unspecified: Secondary | ICD-10-CM

## 2013-12-24 DIAGNOSIS — M79674 Pain in right toe(s): Principal | ICD-10-CM

## 2013-12-24 DIAGNOSIS — B351 Tinea unguium: Secondary | ICD-10-CM

## 2013-12-24 NOTE — Progress Notes (Signed)
Subjective:  This chart was scribed for Shade FloodJeffrey R Decklin Weddington, Caleb Castillo by Elveria Risingimelie Horne, Medial Scribe.  This patient was seen in Room  6 and the patient'Castillo care was started at 11:43 AM.    Patient ID: Caleb Castillo, male    DOB: [birth year], 68 y.o.   MRN: [id number]  HPI Caleb FreshwaterJoseph Castillo is a 68 y.o. male PCP: Caleb Castillo,Caleb Castillo, Caleb Castillo  Patient complaining of ingrown toenail on right great toe, onset over a year. Patient denies any current pain, but says it he experienced throbbing pain six months ago. Patient desires to have nail removed.  Patient also complaining of pain in leg and foot pain, onset over a year. Pain in bottom of feet with standing. Pain in the feet and lower leg with walking. Pain presents in 5-10 minutes after standing. Patient has history of arthritis in both legs.   Patient shares that he last saw his PCP a week ago. States that he visits his PCP once every two months. Patient did not inform PCP of current pain leg and foot pain. At visit blood sugars were controlled in the 100s.   History of fem-pop bypass graft by vascular surgeon Dr. Darrick PennaFields. Last office visit in May 2013. Ectasia of left surperfical femerol artery above the level of the bypass. At that visit plan for repeat doppler scan in one year. Did not have doppler done. Not aware of this plan.    Patient Active Problem List   Diagnosis Date Noted  . Atherosclerosis of native arteries of the extremities with intermittent claudication 03/18/2012   Past Medical History  Diagnosis Date  . Diabetes mellitus   . Hypertension   . Hyperlipidemia   . Arthritis    Past Surgical History  Procedure Laterality Date  . Femoral-peroneal bypass graft     Allergies  Allergen Reactions  . Sulfonamide Derivatives     REACTION: rash   Prior to Admission medications   Medication Sig Start Date End Date Taking? Authorizing Provider  atorvastatin (LIPITOR) 10 MG tablet Take 20 mg by mouth daily.   Yes Historical Provider, Caleb Castillo    clopidogrel (PLAVIX) 75 MG tablet Take 75 mg by mouth daily.   Yes Historical Provider, Caleb Castillo  fish oil-omega-3 fatty acids 1000 MG capsule Take 2 g by mouth daily.   Yes Historical Provider, Caleb Castillo  glimepiride (AMARYL) 2 MG tablet Take 2 mg by mouth daily before breakfast.   Yes Historical Provider, Caleb Castillo  lisinopril-hydrochlorothiazide (PRINZIDE,ZESTORETIC) 20-12.5 MG per tablet Take 1 tablet by mouth daily.   Yes Historical Provider, Caleb Castillo  memantine (NAMENDA) 10 MG tablet Take 10 mg by mouth 2 (two) times daily.   Yes Historical Provider, Caleb Castillo  metFORMIN (GLUCOPHAGE) 500 MG tablet Take 1,000 mg by mouth 2 (two) times daily with a meal.   Yes Historical Provider, Caleb Castillo  metoprolol tartrate (LOPRESSOR) 25 MG tablet Take 25 mg by mouth 2 (two) times daily.   Yes Historical Provider, Caleb Castillo  OVER THE COUNTER MEDICATION Take 1 tablet by mouth daily. Vitamin b-12   Yes Historical Provider, Caleb Castillo  aspirin EC 81 MG tablet Take 81 mg by mouth daily.    Historical Provider, Caleb Castillo  HYDROcodone-acetaminophen [facility](NORCO) 5-325 MG per tablet 1-2 tablets po q 6 hours prn moderate to severe pain 11/12/11   Gavin PoundMichael Y. Ghim, Caleb Castillo  hydrOXYzine (ATARAX/VISTARIL) 25 MG tablet Take 25 mg by mouth 3 (three) times daily as needed. For itching    Historical Provider, Caleb Castillo    Review of Systems  Cardiovascular:       Lower leg and foot pain.  Skin:       Ingrown right great toe nail.       Objective:   Physical Exam  Nursing note and vitals reviewed. Constitutional: He is oriented to person, place, and time. He appears well-developed and well-nourished. No distress.  HENT:  Head: Normocephalic and atraumatic.  Eyes: EOM are normal.  Neck: Neck supple. No tracheal deviation present.  Cardiovascular: Normal rate.   Pulmonary/Chest: Effort normal. No respiratory distress.  Musculoskeletal: Normal range of motion. He exhibits no edema and no tenderness.  No tenderness in plantar foot. No tenderness in plantar fasciae.    Right foot: multiple  discolored, thickened nails. Great toe elongated and inturned with no redness or tenderness in nail folds.  Unable to palpated DP pulse.  Toes are warm with cap refill < 1 sec.   Neurological: He is alert and oriented to person, place, and time.  Skin: Skin is warm and dry. No rash noted.  Right leg:  left medial thigh to mid calf well healed.  Calves are non tender with no significant edema. No swelling.   Left left circumference 35 cm at 15 below patella.  Right leg circumference 33 cm at 15 below patella.   Psychiatric: He has a normal mood and affect. His behavior is normal.       Assessment & Plan:  Jayceion Lisenby is a 68 y.o. male Pain due to onychomycosis of toenail of right foot  - d//t underlying diabetes and PAD with reported poor wound healing, and not painful at present, deferred removal in office in favor of eval by podiatry for nail trimming or removal.  If acutely painful - return for recheck. (rtc precautions). Can discuss lamisil with podiatry or PCP to determine if this is an option for him.    PAD (peripheral artery disease) - may be having claudication sx'Castillo in legs/feet. No swelling of R leg appreciated as above. Calves nt. Advised need for follow up with vascular surgeon and overdue for u/Castillo and ABI'Castillo that were already ordered. rtc precautions.    No orders of the defined types were placed in this encounter.   Patient Instructions  Call Dr. Lisbeth Renshaw office - vascular surgery, to schedule follow up appointment and to discuss ordered studies form last year (ultrasound and ABI test) for suspected peripheral artery disease causing the pains in your leg. Ph: [phone]  Your toenails are thickened from a fungus, and may need to take a medicine for this, but would discuss this medicine with your primary care provider.   Return to the clinic or go to the nearest emergency room if any of your symptoms worsen or new symptoms occur.      I personally performed the services  described in this documentation, which was scribed in my presence. The recorded information has been reviewed and considered, and addended by me as needed.

## 2013-12-24 NOTE — Patient Instructions (Addendum)
Call Dr. Lisbeth RenshawFields's office - vascular surgery, to schedule follow up appointment and to discuss ordered studies form last year (ultrasound and ABI test) for suspected peripheral artery disease causing the pains in your leg. Ph: [phone]  Your toenails are thickened from a fungus, and may need to take a medicine for this, but would discuss this medicine with your primary care provider.   Return to the clinic or go to the nearest emergency room if any of your symptoms worsen or new symptoms occur.

## 2014-01-30 ENCOUNTER — Ambulatory Visit: Payer: Medicare Other | Admitting: Podiatry

## 2014-02-13 ENCOUNTER — Encounter: Payer: Self-pay | Admitting: Podiatry

## 2014-02-13 ENCOUNTER — Ambulatory Visit (INDEPENDENT_AMBULATORY_CARE_PROVIDER_SITE_OTHER): Payer: Medicare Other | Admitting: Podiatry

## 2014-02-13 ENCOUNTER — Ambulatory Visit (INDEPENDENT_AMBULATORY_CARE_PROVIDER_SITE_OTHER): Payer: Medicare Other

## 2014-02-13 VITALS — BP 143/85 | HR 65 | Resp 16

## 2014-02-13 DIAGNOSIS — M79609 Pain in unspecified limb: Secondary | ICD-10-CM

## 2014-02-13 DIAGNOSIS — E1149 Type 2 diabetes mellitus with other diabetic neurological complication: Secondary | ICD-10-CM

## 2014-02-13 DIAGNOSIS — M2140 Flat foot [pes planus] (acquired), unspecified foot: Secondary | ICD-10-CM

## 2014-02-13 DIAGNOSIS — E1159 Type 2 diabetes mellitus with other circulatory complications: Secondary | ICD-10-CM

## 2014-02-13 DIAGNOSIS — B351 Tinea unguium: Secondary | ICD-10-CM

## 2014-02-13 MED ORDER — HYDROCODONE-ACETAMINOPHEN 10-325 MG PO TABS: 1.0000 | ORAL_TABLET | Freq: Three times a day (TID) | ORAL | Status: DC | PRN

## 2014-02-13 NOTE — Progress Notes (Signed)
   Subjective:    Patient ID: Caleb FreshwaterJoseph Castillo, male    DOB: 1945-11-15, 68 y.o.   MRN: [id number]  HPI Comments: "My feet are hurting and got some toenails that are infected"  Patient c/o aching medial foot bilateral for several years. His feet are flat. Worse when walking. Saw PCP, said gave shot in leg? No treatment otherwise.  Also, c/o thick, discolored toenails 1st, 2nd, 3rd nails right foot for several years. Says the 1st right is painful. This nail is long and curving towards 2nd toe. Unable to cut. No treatment.  Foot Pain Associated symptoms include arthralgias, myalgias and weakness.      Review of Systems  Eyes: Positive for itching.  Cardiovascular:       Calf pain with walking  Genitourinary: Positive for urgency.  Musculoskeletal: Positive for arthralgias, back pain, gait problem and myalgias.  Neurological: Positive for weakness.  Hematological: Bruises/bleeds easily.       Slow to heal  Psychiatric/Behavioral: Positive for confusion.  All other systems reviewed and are negative.      Objective:   Physical Exam        Assessment & Plan:

## 2014-02-13 NOTE — Patient Instructions (Signed)
Flat Feet Having flat feet is a common condition. One foot or both might be affected. People of any age can have flat feet. In fact, everyone is born with them. But most of the time, the foot gradually develops an arch. That is the curve on the bottom of the foot that creates a gap between the foot and the ground. An arch usually develops in childhood. Sometimes, though, an arch never develops and the foot stays flat on the bottom. Other times, an arch develops but later collapses (caves in). That is what gives the condition its nickname, "fallen arches." The medical term for flat feet is pes planus. Some people have flat feet their whole life and have no problems. For others, the condition causes pain and needs to be corrected.  CAUSES   A problem with the foot's soft tissue; tendons and ligaments could be loose.  This can cause what is called flexible flat feet. That means the shape of the foot changes with pressure. When standing on the toes, a curved arch can be seen. When standing on the ground, the foot is flat.  Wear and tear. Sometimes arches simply flatten over time.  Damage to the posterior tibial tendon. This is the tendon that goes from the inside of the ankle to the bones in the middle of the foot. It is the main support for the arch. If the tendon is injured, stretched or torn, the arch might flatten.  Tarsal coalition. With this condition, two or more bones in the foot are joined together (fused ) during development in the womb. This limits movement and can lead to a flat foot. SYMPTOMS   The foot is even with the ground from toe to heel. Your caregiver will look closely at the inside of the foot while you are standing.  Pain along the bottom of the foot. Some people describe the pain as tightness.  Swelling on the inside of the foot or ankle.  Changes in the way you walk (gait).  The feet lean inward, starting at the ankle (pronation). DIAGNOSIS  To decide if a child or  adult has flat feet, a healthcare provider will probably:  Do a physical examination. This might include having the person stand on his or her toes and then stand normally. The caregiver will also hold the foot and put pressure on the foot in different directions.  Check the person's shoes. The pattern of wear on the soles can offer clues.  Order images (pictures) of the foot. They can help identify the cause of any pain. They also will show injuries to bones or tendons that could be causing the condition. The images can come from:  X-rays.  Computed tomography (CT) scan. This combines X-ray and a computer.  Magnetic resonance imaging (MRI). This uses magnets, radio waves and a computer to take a picture of the foot. It is the best technique to evaluate tendons, ligaments and muscles. TREATMENT   Flexible flat feet usually are painless. Most of the time, gait is not affected. Most children grow out of the condition. Often no treatment is needed. If there is pain, treatment options include:  Orthotics. These are inserts that go in the shoes. They add support and shape to the feet. An orthotic is custom-made from a mold of the foot.  Shoes. Not all shoes are the same. People with flat feet need arch support. However, too much can be painful. It is important to find shoes that offer the right amount   of support. Athletes, especially runners, may need to try shoes made just for people with flatter feet.  Medication. For pain, only take over-the-counter medicine for pain, discomfort, as directed by your caregiver.  Rest. If the feet start to hurt, cut back on the exercise which increases the pain. Use common sense.  For damage to the posterior tibial tendon, options include:  Orthotics. Also adding a wedge on the inside edge may help. This can relieve pressure on the tendon.  Ankle brace, boot or cast. These supports can ease the load on the tendon while it heals.  Surgery. If the tendon is  torn, it might need to be repaired.  For tarsal coalition, similar options apply:  Pain medication.  Orthotics.  A cast and crutches. This keeps weight off the foot.  Physical therapy.  Surgery to remove the bone bridge joining the two bones together. PROGNOSIS  In most people, flat feet do not cause pain or problems. People can go about their normal activities. However, if flat feet are painful, they can and should be treated. Treatment usually relieves the pain. HOME CARE INSTRUCTIONS   Take any medications prescribed by the healthcare provider. Follow the directions carefully.  Wear, or make sure a child wears, orthotics or special shoes if this was suggested. Be sure to ask how often and for how long they should be worn.  Do any exercises or therapy treatments that were suggested.  Take notes on when the pain occurs. This will help healthcare providers decide how to treat the condition.  If surgery is needed, be sure to find out if there is anything that should or should not be done before the operation. SEEK MEDICAL CARE IF:   Pain worsens in the foot or lower leg.  Pain disappears after treatment, but then returns.  Walking or simple exercise becomes difficult or causes foot pain.  Orthotics or special shoes are uncomfortable or painful. Document Released: 08/03/2009 Document Revised: 12/29/2011 Document Reviewed: 08/03/2009 [employer] Patient Information 2014 [employer].  

## 2014-02-13 NOTE — Progress Notes (Signed)
Subjective:     Patient ID: Caleb Castillo, male   DOB: [birth year], 68 y.o.   MRN: [id number]  Foot Pain   patient presents stating that his feet hurt in general and he can not take care of his nails because they're thick sore and he has long-term diabetes and vascular disease. States that the entire bottom of the feet hurt and that also his lower legs   Review of Systems  All other systems reviewed and are negative.      Objective:   Physical Exam  Nursing note and vitals reviewed. Constitutional: He is oriented to person, place, and time.  Musculoskeletal: Normal range of motion.  Neurological: He is oriented to person, place, and time.  Skin: Skin is dry.   vascular status was found to be diminished with both PT and DP pulses being minimal and shiny tight skin with diminished hair growth noted. Neurological was diminished muscle strength was diminished also with range of motion subtalar midtarsal joint and adequate. 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 Went ahead today and I want to send the vascular but he is scheduled to see Dr. Darrick Pennafields for vascular evaluation in the next 6 weeks. Debrided all nailbeds 1-5 both feet with no bleeding today and scheduled for diabetic shoes in order to try to prevent ulceration as he has had them in the past and I'm hoping it'll help with his foot pain also

## 2015-02-25 ENCOUNTER — Encounter (HOSPITAL_COMMUNITY): Payer: Self-pay

## 2015-02-25 ENCOUNTER — Emergency Department (HOSPITAL_COMMUNITY): Payer: Medicare Other

## 2015-02-25 ENCOUNTER — Emergency Department (HOSPITAL_COMMUNITY)
Admission: EM | Admit: 2015-02-25 | Discharge: 2015-02-25 | Disposition: A | Discharge status: Home / Self Care | Payer: Medicare Other | Attending: Emergency Medicine | Admitting: Emergency Medicine

## 2015-02-25 DIAGNOSIS — E785 Hyperlipidemia, unspecified: Secondary | ICD-10-CM | POA: Insufficient documentation

## 2015-02-25 DIAGNOSIS — I10 Essential (primary) hypertension: Secondary | ICD-10-CM | POA: Diagnosis not present

## 2015-02-25 DIAGNOSIS — S3992XA Unspecified injury of lower back, initial encounter: Secondary | ICD-10-CM | POA: Diagnosis present

## 2015-02-25 DIAGNOSIS — Y998 Other external cause status: Secondary | ICD-10-CM | POA: Diagnosis not present

## 2015-02-25 DIAGNOSIS — W1839XA Other fall on same level, initial encounter: Secondary | ICD-10-CM | POA: Insufficient documentation

## 2015-02-25 DIAGNOSIS — Z79899 Other long term (current) drug therapy: Secondary | ICD-10-CM | POA: Insufficient documentation

## 2015-02-25 DIAGNOSIS — Z87891 Personal history of nicotine dependence: Secondary | ICD-10-CM | POA: Insufficient documentation

## 2015-02-25 DIAGNOSIS — E119 Type 2 diabetes mellitus without complications: Secondary | ICD-10-CM | POA: Diagnosis not present

## 2015-02-25 DIAGNOSIS — M1990 Unspecified osteoarthritis, unspecified site: Secondary | ICD-10-CM | POA: Insufficient documentation

## 2015-02-25 DIAGNOSIS — Z7982 Long term (current) use of aspirin: Secondary | ICD-10-CM | POA: Diagnosis not present

## 2015-02-25 DIAGNOSIS — S20229A Contusion of unspecified back wall of thorax, initial encounter: Secondary | ICD-10-CM | POA: Insufficient documentation

## 2015-02-25 DIAGNOSIS — R296 Repeated falls: Secondary | ICD-10-CM | POA: Diagnosis not present

## 2015-02-25 DIAGNOSIS — Z7902 Long term (current) use of antithrombotics/antiplatelets: Secondary | ICD-10-CM | POA: Diagnosis not present

## 2015-02-25 DIAGNOSIS — Y9389 Activity, other specified: Secondary | ICD-10-CM | POA: Diagnosis not present

## 2015-02-25 DIAGNOSIS — Y9289 Other specified places as the place of occurrence of the external cause: Secondary | ICD-10-CM | POA: Insufficient documentation

## 2015-02-25 DIAGNOSIS — S300XXA Contusion of lower back and pelvis, initial encounter: Secondary | ICD-10-CM

## 2015-02-25 MED ORDER — HYDROCODONE-ACETAMINOPHEN 5-325 MG PO TABS: 2.0000 | ORAL_TABLET | ORAL | Status: DC | PRN

## 2015-02-25 MED ORDER — HYDROCODONE-ACETAMINOPHEN 10-325 MG PO TABS: 1.0000 | ORAL_TABLET | Freq: Three times a day (TID) | ORAL | Status: DC | PRN

## 2015-02-25 MED ORDER — HYDROCODONE-ACETAMINOPHEN 5-325 MG PO TABS
2.0000 | ORAL_TABLET | Freq: Once | ORAL | Status: AC
Start: 2015-02-25 — End: 2015-02-25
  Administered 2015-02-25: 2 via ORAL
  Filled 2015-02-25: qty 2

## 2015-02-25 MED ORDER — HYDROCODONE-ACETAMINOPHEN 5-325 MG PO TABS: ORAL_TABLET | ORAL | Status: AC

## 2015-02-25 MED ORDER — HYDROCODONE-ACETAMINOPHEN 5-325 MG PO TABS: ORAL_TABLET | ORAL | Status: DC

## 2015-02-25 NOTE — ED Provider Notes (Signed)
CSN: [id number]     Arrival date & time 02/25/15  1517 History   First MD Initiated Contact with Patient 02/25/15 1519     Chief Complaint  Patient presents with  . Fall     (Consider location/radiation/quality/duration/timing/severity/associated sxs/prior Treatment) Patient is a 69 y.o. male presenting with back pain. The history is provided by the patient. No language interpreter was used.  Back Pain Location:  Lumbar spine Quality:  Aching Radiates to:  Does not radiate Pain severity:  Moderate Pain is:  Same all the time Onset quality:  Sudden Duration:  2 weeks Timing:  Constant Progression:  Worsening Chronicity:  New Relieved by:  Nothing Worsened by:  Nothing tried Ineffective treatments:  None tried Associated symptoms: no abdominal pain, no fever, no leg pain, no numbness and no paresthesias   Risk factors: hx of osteoporosis     Past Medical History  Diagnosis Date  . Diabetes mellitus   . Hypertension   . Hyperlipidemia   . Arthritis    Past Surgical History  Procedure Laterality Date  . Femoral-peroneal bypass graft     No family history on file. History  Substance Use Topics  . Smoking status: Former Smoker    Types: Cigarettes    Quit date: 03/18/2002  . Smokeless tobacco: Never Used  . Alcohol Use: No    Review of Systems  Constitutional: Negative for fever.  Gastrointestinal: Negative for abdominal pain.  Musculoskeletal: Positive for back pain.  Neurological: Negative for numbness and paresthesias.  All other systems reviewed and are negative.     Allergies  Sulfonamide derivatives  Home Medications   Prior to Admission medications   Medication Sig Start Date End Date Taking? Authorizing Provider  ALPRAZolam (XANAX) 0.25 MG tablet Take 0.25 mg by mouth at bedtime.   Yes Historical Provider, MD  aspirin EC 81 MG tablet Take 81 mg by mouth daily.   Yes Historical Provider, MD  atorvastatin (LIPITOR) 20 MG tablet Take 20 mg by mouth  daily.   Yes Historical Provider, MD  clopidogrel (PLAVIX) 75 MG tablet Take 75 mg by mouth daily.   Yes Historical Provider, MD  cyclobenzaprine (FLEXERIL) 5 MG tablet Take 5 mg by mouth at bedtime as needed for muscle spasms.   Yes Historical Provider, MD  lisinopril-hydrochlorothiazide (PRINZIDE,ZESTORETIC) 20-12.5 MG per tablet Take 1 tablet by mouth daily.   Yes Historical Provider, MD  metFORMIN (GLUCOPHAGE) 500 MG tablet Take 1,000 mg by mouth 2 (two) times daily with a meal.   Yes Historical Provider, MD  metoprolol tartrate (LOPRESSOR) 25 MG tablet Take 25 mg by mouth 2 (two) times daily.   Yes Historical Provider, MD  HYDROcodone-acetaminophen (NORCO) 10-325 MG per tablet Take 1 tablet by mouth every 8 (eight) hours as needed. Patient not taking: Reported on 02/25/2015 02/13/14   Lenn SinkNorman S Regal, DPM  HYDROcodone-acetaminophen (NORCO) 5-325 MG per tablet 1-2 tablets po q 6 hours prn moderate to severe pain Patient not taking: Reported on 02/25/2015 11/12/11   Quita SkyeMichael Ghim, MD   BP 186/98 mmHg  Pulse 102  Temp(Src) 97.8 F (36.6 C) (Oral)  Resp 18  Ht 5\' 7"  (1.702 m)  Wt 170 lb (77.111 kg)  BMI 26.62 kg/m2  SpO2 96% Physical Exam  Constitutional: He is oriented to person, place, and time. He appears well-developed and well-nourished.  HENT:  Head: Normocephalic and atraumatic.  Right Ear: External ear normal.  Mouth/Throat: Oropharynx is clear and moist.  Eyes: Conjunctivae and EOM are  normal. Pupils are equal, round, and reactive to light.  Neck: Normal range of motion.  Cardiovascular: Normal rate and normal heart sounds.   Pulmonary/Chest: Effort normal.  Abdominal: Soft. He exhibits no distension.  Musculoskeletal:  Tender lower lumbar spine,  Abrasion low back  Neurological: He is alert and oriented to person, place, and time.  Skin: Skin is warm.  Psychiatric: He has a normal mood and affect.  Nursing note and vitals reviewed.   ED Course  Procedures (including  critical care time) Labs Review Labs Reviewed - No data to display  Imaging Review Dg Lumbar Spine Complete  02/25/2015   CLINICAL DATA:  Fall last week with back pain, initial encounter  EXAM: LUMBAR SPINE - COMPLETE 4+ VIEW  COMPARISON:  01/09/2011  FINDINGS: Calcifications are noted in the right upper quadrant consistent with the known history of cholelithiasis. 5 lumbar type vertebral bodies are well visualized. Vertebral body height is well maintained. Multilevel osteophytic changes are seen. Retrolisthesis of L2 with respect L3 is again noted and stable. No pars defects are noted. Diffuse aortic calcifications are noted.  IMPRESSION: Multilevel degenerative change stable from the prior exam.  Cholelithiasis.  No acute abnormality seen.   Electronically Signed   By: Alcide CleverMark  Lukens M.D.   On: 02/25/2015 16:26     EKG Interpretation None      MDM  Pt has had frequent falls.   Pt uses a cane.   Pt reports he has a walker but does not like to use it.    Final diagnoses:  Lumbar contusion, initial encounter    Hydrocodone Use walker See your Physician this week for recheck of blood pressure.     Lonia SkinnerLeslie K Hopkins ParkSofia, PA-C 02/25/15 1804  Rolan BuccoMelanie Belfi, MD 02/25/15 2245

## 2015-02-25 NOTE — ED Notes (Signed)
PER EMS: pt from home, pt reports he fell 2 weeks ago and reports right flank pain and pain is becoming worse so he decided to call EMS today. He states his feet gave out. Hx of trouble walking.  BP-218/108 (not compliant with BP meds), HR-108. A&OX4. CBG-238.

## 2015-02-25 NOTE — Discharge Instructions (Signed)
Contusion °A contusion is a deep bruise. Contusions are the result of an injury that caused bleeding under the skin. The contusion may turn blue, purple, or yellow. Minor injuries will give you a painless contusion, but more severe contusions may stay painful and swollen for a few weeks.  °CAUSES  °A contusion is usually caused by a blow, trauma, or direct force to an area of the body. °SYMPTOMS  °· Swelling and redness of the injured area. °· Bruising of the injured area. °· Tenderness and soreness of the injured area. °· Pain. °DIAGNOSIS  °The diagnosis can be made by taking a history and physical exam. An X-ray, CT scan, or MRI may be needed to determine if there were any associated injuries, such as fractures. °TREATMENT  °Specific treatment will depend on what area of the body was injured. In general, the best treatment for a contusion is resting, icing, elevating, and applying cold compresses to the injured area. Over-the-counter medicines may also be recommended for pain control. Ask your caregiver what the best treatment is for your contusion. °HOME CARE INSTRUCTIONS  °· Put ice on the injured area. °¨ Put ice in a plastic bag. °¨ Place a towel between your skin and the bag. °¨ Leave the ice on for 15-20 minutes, 3-4 times a day, or as directed by your health care provider. °· Only take over-the-counter or prescription medicines for pain, discomfort, or fever as directed by your caregiver. Your caregiver may recommend avoiding anti-inflammatory medicines (aspirin, ibuprofen, and naproxen) for 48 hours because these medicines may increase bruising. °· Rest the injured area. °· If possible, elevate the injured area to reduce swelling. °SEEK IMMEDIATE MEDICAL CARE IF:  °· You have increased bruising or swelling. °· You have pain that is getting worse. °· Your swelling or pain is not relieved with medicines. °MAKE SURE YOU:  °· Understand these instructions. °· Will watch your condition. °· Will get help right  away if you are not doing well or get worse. °Document Released: 07/16/2005 Document Revised: 10/11/2013 Document Reviewed: 08/11/2011 °[employer]® Patient Information ©2015 [employer]. This information is not intended to replace advice given to you by your health care provider. Make sure you discuss any questions you have with your health care provider. ° °

## 2015-12-22 ENCOUNTER — Emergency Department (HOSPITAL_COMMUNITY): Payer: Medicare Other

## 2015-12-22 ENCOUNTER — Encounter (HOSPITAL_COMMUNITY): Payer: Self-pay | Admitting: Emergency Medicine

## 2015-12-22 ENCOUNTER — Encounter (HOSPITAL_COMMUNITY): Admission: EM | Disposition: E | Payer: Self-pay | Source: Home / Self Care | Attending: Emergency Medicine

## 2015-12-22 ENCOUNTER — Emergency Department (HOSPITAL_COMMUNITY)
Admission: EM | Admit: 2015-12-22 | Discharge: 2016-01-19 | Disposition: E | Payer: Medicare Other | Attending: Emergency Medicine | Admitting: Emergency Medicine

## 2015-12-22 DIAGNOSIS — Z79899 Other long term (current) drug therapy: Secondary | ICD-10-CM | POA: Insufficient documentation

## 2015-12-22 DIAGNOSIS — Z87891 Personal history of nicotine dependence: Secondary | ICD-10-CM | POA: Insufficient documentation

## 2015-12-22 DIAGNOSIS — I2119 ST elevation (STEMI) myocardial infarction involving other coronary artery of inferior wall: Secondary | ICD-10-CM | POA: Diagnosis not present

## 2015-12-22 DIAGNOSIS — Z7982 Long term (current) use of aspirin: Secondary | ICD-10-CM | POA: Insufficient documentation

## 2015-12-22 DIAGNOSIS — I469 Cardiac arrest, cause unspecified: Secondary | ICD-10-CM | POA: Diagnosis not present

## 2015-12-22 DIAGNOSIS — R55 Syncope and collapse: Secondary | ICD-10-CM | POA: Diagnosis not present

## 2015-12-22 DIAGNOSIS — R110 Nausea: Secondary | ICD-10-CM | POA: Insufficient documentation

## 2015-12-22 DIAGNOSIS — E119 Type 2 diabetes mellitus without complications: Secondary | ICD-10-CM | POA: Diagnosis not present

## 2015-12-22 DIAGNOSIS — M1990 Unspecified osteoarthritis, unspecified site: Secondary | ICD-10-CM | POA: Diagnosis not present

## 2015-12-22 DIAGNOSIS — E785 Hyperlipidemia, unspecified: Secondary | ICD-10-CM | POA: Diagnosis not present

## 2015-12-22 DIAGNOSIS — R4182 Altered mental status, unspecified: Secondary | ICD-10-CM | POA: Insufficient documentation

## 2015-12-22 DIAGNOSIS — I10 Essential (primary) hypertension: Secondary | ICD-10-CM | POA: Insufficient documentation

## 2015-12-22 LAB — CBC
HCT: 31.9 % — ABNORMAL LOW (ref 39.0–52.0)
Hemoglobin: 9.3 g/dL — ABNORMAL LOW (ref 13.0–17.0)
MCH: 27.4 pg (ref 26.0–34.0)
MCHC: 29.2 g/dL — ABNORMAL LOW (ref 30.0–36.0)
MCV: 94.1 fL (ref 78.0–100.0)
PLATELETS: 269 10*3/uL (ref 150–400)
RBC: 3.39 MIL/uL — ABNORMAL LOW (ref 4.22–5.81)
RDW: 14.6 % (ref 11.5–15.5)
WBC: 23 10*3/uL — AB (ref 4.0–10.5)

## 2015-12-22 LAB — COMPREHENSIVE METABOLIC PANEL
ALT: 1079 U/L — ABNORMAL HIGH (ref 17–63)
ANION GAP: 27 — AB (ref 5–15)
AST: 974 U/L — AB (ref 15–41)
Albumin: 2.4 g/dL — ABNORMAL LOW (ref 3.5–5.0)
Alkaline Phosphatase: 96 U/L (ref 38–126)
BUN: 17 mg/dL (ref 6–20)
CHLORIDE: 104 mmol/L (ref 101–111)
CO2: 9 mmol/L — ABNORMAL LOW (ref 22–32)
Calcium: 8.6 mg/dL — ABNORMAL LOW (ref 8.9–10.3)
Creatinine, Ser: 1.64 mg/dL — ABNORMAL HIGH (ref 0.61–1.24)
GFR, EST AFRICAN AMERICAN: 48 mL/min — AB (ref >60)
GFR, EST NON AFRICAN AMERICAN: 41 mL/min — AB (ref >60)
Glucose, Bld: 370 mg/dL — ABNORMAL HIGH (ref 65–99)
POTASSIUM: 3.5 mmol/L (ref 3.5–5.1)
Sodium: 140 mmol/L (ref 135–145)
Total Bilirubin: 0.3 mg/dL (ref 0.3–1.2)
Total Protein: 6.2 g/dL — ABNORMAL LOW (ref 6.5–8.1)

## 2015-12-22 LAB — I-STAT TROPONIN, ED: TROPONIN I, POC: 3.55 ng/mL — AB (ref 0.00–0.08)

## 2015-12-22 LAB — CBG MONITORING, ED: Glucose-Capillary: 288 mg/dL — ABNORMAL HIGH (ref 65–99)

## 2015-12-22 LAB — APTT: aPTT: 35 seconds (ref 24–37)

## 2015-12-22 LAB — PROTIME-INR
INR: 1.41 (ref 0.00–1.49)
PROTHROMBIN TIME: 17.3 s — AB (ref 11.6–15.2)

## 2015-12-22 SURGERY — INVASIVE LAB ABORTED CASE

## 2015-12-22 MED ORDER — DOPAMINE-DEXTROSE 3.2-5 MG/ML-% IV SOLN
INTRAVENOUS | Status: AC | PRN
  Administered 2015-12-22: 10 ug/kg/min via INTRAVENOUS

## 2015-12-22 MED ORDER — HEPARIN SODIUM (PORCINE) 5000 UNIT/ML IJ SOLN
4000.0000 [IU] | Freq: Once | INTRAMUSCULAR | Status: AC
  Administered 2015-12-22: 4000 [IU] via INTRAVENOUS

## 2015-12-22 MED ORDER — EPINEPHRINE HCL 0.1 MG/ML IJ SOSY
PREFILLED_SYRINGE | INTRAMUSCULAR | Status: AC | PRN
  Administered 2015-12-22 (×8): 1 mg via INTRAVENOUS

## 2015-12-22 MED ORDER — NOREPINEPHRINE BITARTRATE 1 MG/ML IV SOLN
5.0000 ug/min | Freq: Once | INTRAVENOUS | Status: AC
  Administered 2015-12-22: 5 ug/min via INTRAVENOUS

## 2015-12-22 MED ORDER — SODIUM CHLORIDE 0.9 % IV SOLN: INTRAVENOUS | Status: DC

## 2015-12-22 MED ORDER — SODIUM CHLORIDE 0.9 % IV SOLN
INTRAVENOUS | Status: AC | PRN
  Administered 2015-12-22 (×2): 1000 mL via INTRAVENOUS

## 2015-12-22 SURGICAL SUPPLY — 10 items
CATH INFINITI 5FR ANG PIGTAIL (CATHETERS) ×4 IMPLANT
CATH OPTITORQUE JACKY 4.0 5F (CATHETERS) ×4 IMPLANT
DEVICE RAD COMP TR BAND LRG (VASCULAR PRODUCTS) ×4 IMPLANT
GLIDESHEATH SLEND SS 6F .021 (SHEATH) ×3 IMPLANT
KIT HEART LEFT (KITS) ×4 IMPLANT
PACK CARDIAC CATHETERIZATION (CUSTOM PROCEDURE TRAY) ×4 IMPLANT
SYR MEDRAD MARK V 150ML (SYRINGE) ×4 IMPLANT
TRANSDUCER W/STOPCOCK (MISCELLANEOUS) ×4 IMPLANT
TUBING CIL FLEX 10 FLL-RA (TUBING) ×4 IMPLANT
WIRE SAFE-T 1.5MM-J .035X260CM (WIRE) ×4 IMPLANT

## 2015-12-24 MED FILL — Medication: Qty: 1 | Status: AC

## 2016-01-19 NOTE — ED Notes (Signed)
[facility] notified on pt.'s demise.

## 2016-01-19 NOTE — Code Documentation (Signed)
Chest compressions continues. 

## 2016-01-19 NOTE — ED Provider Notes (Signed)
CSN: [id number]     Arrival date & time 12-29-15  0520 History   First MD Initiated Contact with Patient 2015-12-29 [id number]     Chief Complaint  Patient presents with  . Cardiac Arrest   LEVEL 5 CAVEAT DUE TO ACUITY OF CONDITION  Patient is a 70 y.o. male presenting with altered mental status. The history is provided by the EMS personnel. The history is limited by the condition of the patient.  Altered Mental Status Presenting symptoms: unresponsiveness   Severity:  Severe Most recent episode:  Today Timing:  Constant Progression:  Worsening Chronicity:  New Associated symptoms: nausea   patient presents from nursing facility for unresponsiveness Per EMS report, he had nausea at approximately 2300, he received medications and improved He had return of nausea at 0400, he was checked on by nursing staff and he was pale and ill appearing and then went unresponsive.  CPR was started and EMS was called Per EMS, pt went into cardiac arrest twice and required CPR and also epinephrine and narcan A king airway was placed prior to arrival   Pt has multiple medical problems including prior stroke.  Past Medical History  Diagnosis Date  . Diabetes mellitus   . Hypertension   . Hyperlipidemia   . Arthritis    Past Surgical History  Procedure Laterality Date  . Femoral-peroneal bypass graft     No family history on file. Social History  Substance Use Topics  . Smoking status: Former Smoker    Types: Cigarettes    Quit date: 03/18/2002  . Smokeless tobacco: Never Used  . Alcohol Use: No    Review of Systems  Unable to perform ROS: Acuity of condition  Gastrointestinal: Positive for nausea.      Allergies  Sulfonamide derivatives  Home Medications   Prior to Admission medications   Medication Sig Start Date End Date Taking? Authorizing Provider  ALPRAZolam (XANAX) 0.25 MG tablet Take 0.25 mg by mouth at bedtime.    Historical Provider, MD  aspirin EC 81 MG tablet Take 81 mg by  mouth daily.    Historical Provider, MD  atorvastatin (LIPITOR) 20 MG tablet Take 20 mg by mouth daily.    Historical Provider, MD  clopidogrel (PLAVIX) 75 MG tablet Take 75 mg by mouth daily.    Historical Provider, MD  cyclobenzaprine (FLEXERIL) 5 MG tablet Take 5 mg by mouth at bedtime as needed for muscle spasms.    Historical Provider, MD  HYDROcodone-acetaminophen (NORCO/VICODIN) 5-325 MG per tablet 1-2 tablets po q 6 hours prn moderate to severe pain 02/25/15   Elson Areas, PA-C  lisinopril-hydrochlorothiazide (PRINZIDE,ZESTORETIC) 20-12.5 MG per tablet Take 1 tablet by mouth daily.    Historical Provider, MD  metFORMIN (GLUCOPHAGE) 500 MG tablet Take 1,000 mg by mouth 2 (two) times daily with a meal.    Historical Provider, MD  metoprolol tartrate (LOPRESSOR) 25 MG tablet Take 25 mg by mouth 2 (two) times daily.    Historical Provider, MD   BP 126/33 mmHg  Pulse 88  Resp 24 Physical Exam CONSTITUTIONAL: elderly, unresponsive, ill appearing HEAD: Normocephalic/atraumatic EYES: pupils pinpoint ENMT: king airway in mouth.   NECK: supple no meningeal signs CV: S1/S2 noted,no loud murmurs LUNGS: coarse BS bilaterally with bagging ABDOMEN: soft, nontender NEURO: Pt is unresponsive EXTREMITIES: IO in right LE, no deformities noted SKIN: cool to touch PSYCH:unable to assess  ED Course  .Cardioversion Date/Time: December 29, 2015 5:45 AM Performed by: Zadie Rhine Authorized by: Zadie Rhine Consent:  The procedure was performed in an emergent situation. Cardioversion basis: emergent Pre-procedure rhythm: ventricular tachycardia Patient position: patient was placed in a supine position Chest area: chest area exposed Electrodes: pads Electrodes placed: anterior-posterior Number of attempts: 1 Attempt 1 mode: synchronous Attempt 1 shock (in Joules): 120 Attempt 1 outcome: conversion to normal sinus rhythm Post-procedure rhythm: normal sinus rhythm Complications: no  complications Patient tolerance: Patient tolerated the procedure well with no immediate complications    CRITICAL CARE Performed by: Joya GaskinsWICKLINE,Hannia Matchett W Total critical care time: 35 minutes Critical care time was exclusive of separately billable procedures and treating other patients. Critical care was necessary to treat or prevent imminent or life-threatening deterioration. Critical care was time spent personally by me on the following activities: development of treatment plan with patient and/or surrogate as well as nursing, discussions with consultants, evaluation of patient's response to treatment, examination of patient, obtaining history from patient or surrogate, ordering and performing treatments and interventions, ordering and review of laboratory studies, ordering and review of radiographic studies, pulse oximetry and re-evaluation of patient's condition. PATIENT REQUIRED DOPAMINE FOR BLOOD PRESSURE SUPPORT.  I CONSULTED CARDIOLOGY WHO SAW PATIENT IN THE ED, PATIENT WAS CARDIOVERTED WHILE IN THE ED AND HE REQUIRED FREQUENT RE-EVALUATIONS  CPR Procedure Note I PERSONALLY DIRECTED ANCILLARY STAFF OR/PERFORMED CPR IN AN EFFORT TO REGAIN RETURN OF SPONTANEOUS CIRCULATION IN AN EFFORT TO MAINTAIN NEURO, CARDIAC AND SYSTEMIC PERFUSION  INTUBATION Performed by: Joya GaskinsWICKLINE,Viren Lebeau W  Required items: required special equipment available Patient identity confirmed: provided demographic data and hospital-assigned identification number Time out not called due to emergent situation  Indications: unresponsive  Intubation method: Glidescope Laryngoscopy   Preoxygenation: BVM  Tube Size: 7.5 cuffed Patient copious vomit in mouth on examination after king airway was removed, this was suctioned prior to intubation Post-procedure assessment: chest rise and ETCO2 detector Breath sounds: equal and absent over the epigastrium Tube secured with: ETT holder Chest x-ray interpreted by radiologist and  me.  Chest x-ray findings: endotracheal tube in appropriate position     Labs Review Labs Reviewed  CBC - Abnormal; Notable for the following:    WBC 23.0 (*)    RBC 3.39 (*)    Hemoglobin 9.3 (*)    HCT 31.9 (*)    MCHC 29.2 (*)    All other components within normal limits  COMPREHENSIVE METABOLIC PANEL - Abnormal; Notable for the following:    CO2 9 (*)    Glucose, Bld 370 (*)    Creatinine, Ser 1.64 (*)    Calcium 8.6 (*)    Total Protein 6.2 (*)    Albumin 2.4 (*)    AST 974 (*)    ALT 1079 (*)    GFR calc non Af Amer 41 (*)    GFR calc Af Amer 48 (*)    Anion gap 27 (*)    All other components within normal limits  PROTIME-INR - Abnormal; Notable for the following:    Prothrombin Time 17.3 (*)    All other components within normal limits  I-STAT TROPOININ, ED - Abnormal; Notable for the following:    Troponin i, poc 3.55 (*)    All other components within normal limits  APTT    Imaging Review Dg Chest Port 1 View  12/19/2015  CLINICAL DATA:  Endotracheal tube placement.  Initial encounter. EXAM: PORTABLE CHEST 1 VIEW COMPARISON:  None. FINDINGS: The patient's endotracheal tube is seen ending 2 cm above the carina. The lungs are mildly hypoexpanded. Vascular congestion is  noted, with patchy bilateral airspace opacities, concerning for multifocal pneumonia. Interstitial edema could have a similar appearance. No pleural effusion or pneumothorax is seen. The cardiomediastinal silhouette is borderline normal in size. The patient is status post median sternotomy. External pacing pads are noted. No acute osseous abnormalities are identified. IMPRESSION: 1. Endotracheal tube seen ending 2 cm above the carina. 2. Lungs mildly hypoexpanded. Vascular congestion, with bilateral patchy airspace opacities, concerning multifocal pneumonia. Interstitial edema could have a similar appearance. Electronically Signed   By: Roanna Raider M.D.   On: 01-16-16 05:52   I have personally  reviewed and evaluated these images and lab results as part of my medical decision-making.  ED ECG REPORT   Date: 01-16-2016 1610  Rate: 49  Rhythm: indeterminate  QRS Axis: normal  Intervals: normal  ST/T Wave abnormalities: ST elevations inferiorly  Conduction Disutrbances:none +STEMI  I have personally reviewed the EKG tracing and agree with the computerized printout as noted.    ED COURSE: PATIENT SEEN ON [facility]RIVAL PRE-HOSPITAL EKG AND ALSO ED EKG REVEALED INFERIOR STEMI I CALLED A CODE STEMI IMMEDIATELY AT APPROXIMATELY 0524 I SPOKE TO DR ARIDA CARDIOLOGY AT [facility] AND CATH LAB ACTIVATED PT THEN HAD EPISODES OF CARDIAC ARREST, WE WOULD INITIATE CPR WITH EPINEPHRINE AND PATIENT WOULD RESPOND PT WAS INTUBATED AND PLACED ON VENTILATOR HE DID HAVE EPISODE OF V-TACH WITH PULSES AND HE WAS CARDIOVERTED PT STABILIZED FOR SEVERAL MINUTES AND DOPAMINE WAS STARTED FOR BP SUPPORT DR ARIDA ARRIVED TO EVALUATE PATIENT SOON AFTER PATIENT HAD ANOTHER EPISODE OF CARDIAC ARREST AND CPR WAS INSTITUTED AND GIVEN EPINEPHRINE CPR WAS CONTINUED BUT DESPITE THIS WE WERE UNABLE TO REGAIN SUSTAINED ROSC PATIENT EVENTUALLY WENT INTO ASYSTOLE TIME OF DEATH AT APPROXIMATELY 0636 AND DR ARIDA WAS PRESENT DURING THIS TIME DR Kirke Corin ATTEMPTED TO CALL NEXT OF KIN ON NURSING HOME PAPERWORK BUT NO ONE ANSWERED  MDM   Final diagnoses:  Cardiac arrest [facility])    Nursing notes including past medical history and social history reviewed and considered in documentation xrays/imaging reviewed by myself and considered during evaluation Labs/vital reviewed myself and considered during evaluation     Zadie Rhine, MD 01/16/2016 [phone]

## 2016-01-19 NOTE — ED Notes (Addendum)
Post Mortem care rendered , bilateral eyes prepared for potential donation , no family member present at this time , cardiologist and nurse attempted several times to call sister - in- law Caleb Castillo with no success .

## 2016-01-19 NOTE — Code Documentation (Addendum)
Dopamine drip infusing at 10 mcg/min , NS IV  bolus infusing , HR= 48/min , ETT intact . IV sites unremarkable .

## 2016-01-19 NOTE — Code Documentation (Signed)
Pulse check : Right femoral pulse present .

## 2016-01-19 NOTE — ED Notes (Signed)
Post mortem care initiated , [facility] notified , EDP advised nurse that pt. will not be a medical examiner case. Case No. [id number] , Representative - Mr. Lennox GrumblesKirin Lavu.

## 2016-01-19 NOTE — Code Documentation (Signed)
Pulse check : pulseless /Vfib , shocked and chest compressions continues.

## 2016-01-19 NOTE — ED Notes (Signed)
Bed Control notified on pt.'s death / information .

## 2016-01-19 NOTE — Code Documentation (Signed)
Pulse check : femoral pulses present .

## 2016-01-19 NOTE — Code Documentation (Signed)
Chest compressions initiated - PEA . 

## 2016-01-19 NOTE — Code Documentation (Addendum)
Chest compressions continues. 

## 2016-01-19 NOTE — Code Documentation (Signed)
Dr. Kirke CorinArida ( cardiologist ) arrived to evaluate pt . , Dopamine drip infusing at 5520mcg/min, pulses weak .

## 2016-01-19 NOTE — Code Documentation (Signed)
Chest compressions restarted - PEA .

## 2016-01-19 NOTE — Code Documentation (Signed)
Pulse check : PEA - chest compressions continues.

## 2016-01-19 NOTE — Code Documentation (Addendum)
Chest compressions initiated - PEA .

## 2016-01-19 NOTE — ED Notes (Addendum)
Pt. arrived from [facility] nursing home , found by staff unresponsive / pulseless at 4 a this morning - CPR initiated , received Epinephrine 1 mg x7 doses and Narcan 2 mg IV prior to arrival , intubated at scene by EMS . arrived NSR 78/min, weak peripheral pulses.

## 2016-01-19 NOTE — Code Documentation (Signed)
Pulse check : no pulse , chest compressions continues.  

## 2016-01-19 NOTE — Code Documentation (Signed)
Pulse check. No pulse compressions resumed 

## 2016-01-19 NOTE — Code Documentation (Signed)
Pulse check : weak femoral pulses present .

## 2016-01-19 NOTE — Code Documentation (Addendum)
Portable chest x-ray done . ETT intact . Chest compressions restarted for PEA.

## 2016-01-19 NOTE — Code Documentation (Signed)
Chest compressions restarted - no pulse.

## 2016-01-19 NOTE — Code Documentation (Signed)
Pulse check : Weak femoral pulse present .

## 2016-01-19 NOTE — Code Documentation (Addendum)
Pt. shocked for pulseless V-tach during intubation .

## 2016-01-19 NOTE — Code Documentation (Signed)
Pulse check : femoral pulse present . HR=64/min.

## 2016-01-19 NOTE — Procedures (Signed)
Extubation Procedure Note  Patient Details:   Name: Caleb Castillo DOB: 10/05/1946 MRN: [id number]   Airway Documentation:  Airway 7.5 mm (Active)  Secured at (cm) 22 cm 01/03/2016  5:42 AM  Measured From Lips 01/16/2016  5:42 AM  Secured Location Center 12/26/2015  5:42 AM  Secured By [employer] Tube Holder 12/23/2015  5:42 AM  Site Condition Cool;Dry 12/25/2015  5:42 AM    Evaluation  O2 sats: expired Complications: No apparent complications Patient  tolerate procedure well.   Suctioning: Airway No  Rushie ChestnutReid, Analaura Messler [facility] 01/06/2016, 6:44 AM

## 2016-01-19 NOTE — Code Documentation (Addendum)
Pulse check : weak right femoral pulse present .

## 2016-01-19 NOTE — Code Documentation (Signed)
Pulse check. Asystole. 

## 2016-01-19 NOTE — Code Documentation (Signed)
Dr. Kirke CorinArida ( cardiologist ) at bedside attempting to call next of kin / nursing home .

## 2016-01-19 NOTE — Code Documentation (Signed)
Dr. Kirke CorinArida declared time of death 6:36 am .

## 2016-01-19 NOTE — H&P (Signed)
History and Physical  Patient ID: Caleb Castillo MRN: [id number] DOB/AGE: [id number] y.o. Admit date: 01/13/2016  Primary Care Physician: Ricki RodriguezKADAKIA,AJAY S, MD Primary Cardiologist    HPI: This is a 70 y/o male with known history of CAD s/p remote CABG, stroke, DM and hypertension. He is a longterm nursing home resident due to previous stroke. He walks slowly with a walker according to notes.  He had a witnessed unresponsive event in NH around 4 am. He was in PEA and CPR was initiated. He was brought to ED. ECG showed inferior STE. A code STEMI was called. However, he remained unstable with hypotension and intermittent PEA arrest mostly requiring continues CPR. He was showed twice for ventricular fibrillation.  I arrived at the bed side, he was hypotensive but with pulse. He was on maximal dose Dopamine. I added Levophed but he continued to be hypotensive. He want into PEA again and CPR was resumed. During all of this, I attempted to call the only contact number for his sister in law but there was no answer.  In total , he received 16 round of Epi and total CPR time >90 minutes. Thus, I felt it was futile to continue. He was pronounced dead at 6:36 am.   A  review of system was not possible as the patient was not responsive.   Past Medical History  Diagnosis Date  . Diabetes mellitus   . Hypertension   . Hyperlipidemia   . Arthritis     family history  : not able to obtain as the patient was not able to provide.   Social History   Social History  . Marital Status: Divorced    Spouse Name: N/A  . Number of Children: N/A  . Years of Education: N/A   Occupational History  . Not on file.   Social History Main Topics  . Smoking status: Former Smoker    Types: Cigarettes    Quit date: 03/18/2002  . Smokeless tobacco: Never Used  . Alcohol Use: No  . Drug Use: No  . Sexual Activity: No   Other Topics Concern  . Not on file   Social History Narrative    Past Surgical  History  Procedure Laterality Date  . Femoral-peroneal bypass graft        (Not in a hospital admission)  Physical Exam: Blood pressure 47/28, pulse 48, resp. rate 23, SpO2 48 %.   Constitutional:  Intubated and not responsive.  HENT: No nasal discharge.  Head: Normocephalic and atraumatic.  Eyes: Pupils are equal and round.  No discharge. Neck:  Neck supple. No JVD present. No thyromegaly present.  Cardiovascular: Normal rate, regular rhythm, normal heart sounds. Exam reveals no gallop and no friction rub. No murmur heard.  Pulmonary/Chest: intubated with blood secretions noted in ET tube.    Abdominal: Soft. Bowel sounds are normal. He exhibits no distension. There is no tenderness. There is no rebound and no guarding.  Musculoskeletal: Normal range of motion. No edema and no tenderness.  Neurological: Not able to evaluate.  Skin: Skin is cool and dry. No rash noted. He is not diaphoretic. No erythema. Psychiatric: not able to evaluate.    Labs:   Lab Results  Component Value Date   WBC 23.0* 03-18-2016   HGB 9.3* 03-18-2016   HCT 31.9* 03-18-2016   MCV 94.1 03-18-2016   PLT 269 03-18-2016    Recent Labs Lab 09/22/2016 0548  NA 140  K 3.5  CL 104  CO2 9*  BUN 17  CREATININE 1.64*  CALCIUM 8.6*  PROT 6.2*  BILITOT 0.3  ALKPHOS 96  ALT 1079*  AST 974*  GLUCOSE 370*   No results found for: CKTOTAL, CKMB, CKMBINDEX, TROPONINI    Radiology: Dg Chest Port 1 View  25-Dec-2015  CLINICAL DATA:  Endotracheal tube placement.  Initial encounter. EXAM: PORTABLE CHEST 1 VIEW COMPARISON:  None. FINDINGS: The patient's endotracheal tube is seen ending 2 cm above the carina. The lungs are mildly hypoexpanded. Vascular congestion is noted, with patchy bilateral airspace opacities, concerning for multifocal pneumonia. Interstitial edema could have a similar appearance. No pleural effusion or pneumothorax is seen. The cardiomediastinal silhouette is borderline normal in size. The  patient is status post median sternotomy. External pacing pads are noted. No acute osseous abnormalities are identified. IMPRESSION: 1. Endotracheal tube seen ending 2 cm above the carina. 2. Lungs mildly hypoexpanded. Vascular congestion, with bilateral patchy airspace opacities, concerning multifocal pneumonia. Interstitial edema could have a similar appearance. Electronically Signed   By: Roanna Raider M.D.   On: 2015-12-25 05:52    EKG: Independently reviewed by me and showed NSR with infeiror STE  ASSESSMENT AND PLAN:   1. Cardiac arrest with cardiogenic shock: likely due to inferior STEMI. Prolonged CPR time and the patient did not survive in spite of aggressive CPR. Time of death 6:36 am I attempted to contact sister in law (only contact number listed in chart) but there was no answer.   Signed: Lorine Bears MD, [facility] 12-25-15, 6:57 AM

## 2016-01-19 DEATH — deceased

## 2016-03-24 IMAGING — DX DG CHEST 1V PORT
1 series · 1 of 1 positions shown · non-contrast
Comparison: None.

CLINICAL DATA: Endotracheal tube placement.  Initial encounter.

EXAM:
PORTABLE CHEST 1 VIEW

[chest ap]
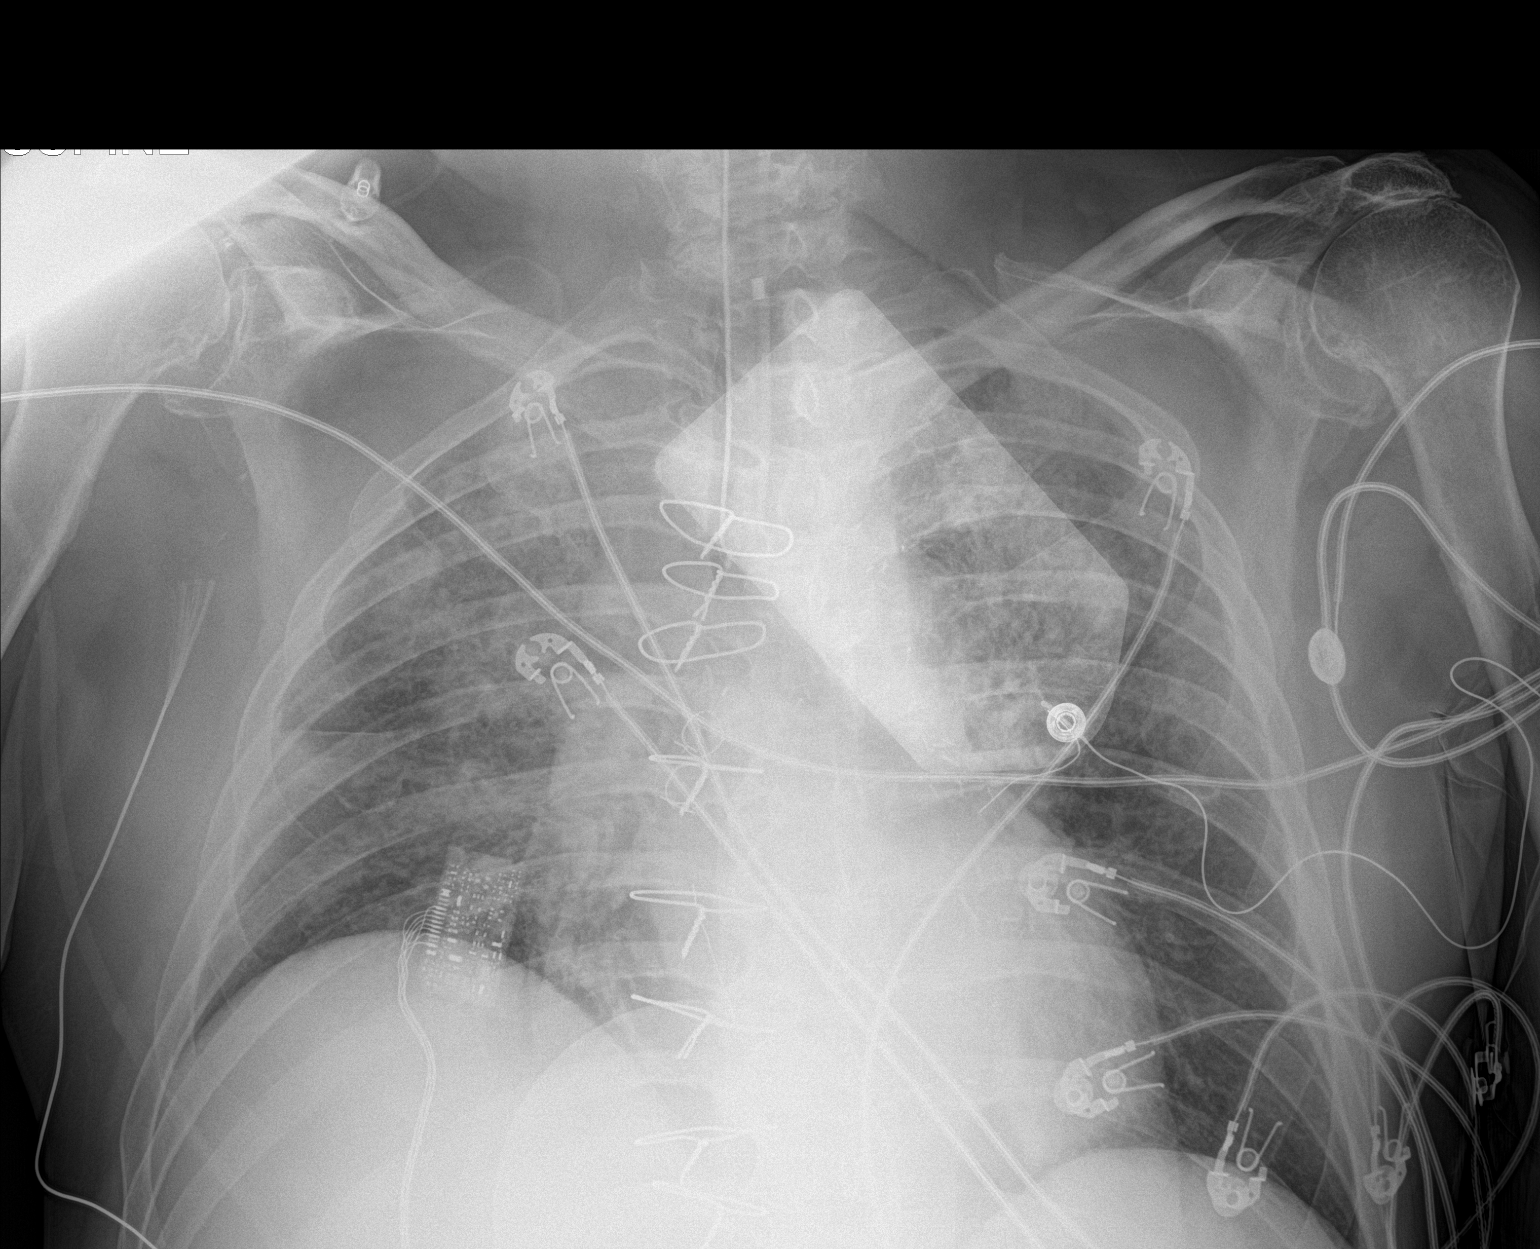

[1 of 1 positions shown; findings below may reference images not displayed]

FINDINGS: The patient's endotracheal tube is seen ending 2 cm above the
carina. The lungs are mildly hypoexpanded. Vascular congestion is
noted, with patchy bilateral airspace opacities, concerning for
multifocal pneumonia. Interstitial edema could have a similar
appearance. No pleural effusion or pneumothorax is seen.

The cardiomediastinal silhouette is borderline normal in size. The
patient is status post median sternotomy. External pacing pads are
noted. No acute osseous abnormalities are identified.
IMPRESSION: 1. Endotracheal tube seen ending 2 cm above the carina.
2. Lungs mildly hypoexpanded. Vascular congestion, with bilateral
patchy airspace opacities, concerning multifocal pneumonia.
Interstitial edema could have a similar appearance.
